# Patient Record
Sex: Female | Born: 1960 | Race: White | Hispanic: No | Marital: Married | Smoking: Never smoker
Health system: Southern US, Community
[De-identification: ages and names within clinical notes are randomized; demographics above are authoritative.]

## PROBLEM LIST (undated history)

## (undated) DIAGNOSIS — K219 Gastro-esophageal reflux disease without esophagitis: Secondary | ICD-10-CM

## (undated) DIAGNOSIS — F419 Anxiety disorder, unspecified: Secondary | ICD-10-CM

## (undated) DIAGNOSIS — F329 Major depressive disorder, single episode, unspecified: Secondary | ICD-10-CM

## (undated) DIAGNOSIS — I499 Cardiac arrhythmia, unspecified: Secondary | ICD-10-CM

## (undated) DIAGNOSIS — R51 Headache: Secondary | ICD-10-CM

## (undated) DIAGNOSIS — A159 Respiratory tuberculosis unspecified: Secondary | ICD-10-CM

## (undated) DIAGNOSIS — R519 Headache, unspecified: Secondary | ICD-10-CM

## (undated) DIAGNOSIS — F32A Depression, unspecified: Secondary | ICD-10-CM

## (undated) DIAGNOSIS — Z87442 Personal history of urinary calculi: Secondary | ICD-10-CM

## (undated) HISTORY — PX: BREAST BIOPSY: SHX20

## (undated) HISTORY — PX: WISDOM TOOTH EXTRACTION: SHX21

## (undated) HISTORY — PX: TONSILLECTOMY: SUR1361

## (undated) HISTORY — PX: OTHER SURGICAL HISTORY: SHX169

## (undated) HISTORY — PX: HERNIA REPAIR: SHX51

---

## 2012-09-01 ENCOUNTER — Other Ambulatory Visit: Payer: Self-pay | Admitting: Family Medicine

## 2012-09-01 DIAGNOSIS — Z1231 Encounter for screening mammogram for malignant neoplasm of breast: Secondary | ICD-10-CM

## 2012-09-21 ENCOUNTER — Ambulatory Visit
Admission: RE | Admit: 2012-09-21 | Discharge: 2012-09-21 | Disposition: A | Discharge status: Home / Self Care | Payer: Managed Care, Other (non HMO) | Source: Ambulatory Visit | Attending: Family Medicine | Admitting: Family Medicine

## 2012-09-21 DIAGNOSIS — Z1231 Encounter for screening mammogram for malignant neoplasm of breast: Secondary | ICD-10-CM

## 2013-09-27 ENCOUNTER — Other Ambulatory Visit: Payer: Self-pay | Admitting: Physician Assistant

## 2013-09-27 DIAGNOSIS — H9319 Tinnitus, unspecified ear: Secondary | ICD-10-CM

## 2013-09-27 DIAGNOSIS — H9122 Sudden idiopathic hearing loss, left ear: Secondary | ICD-10-CM

## 2013-10-03 ENCOUNTER — Ambulatory Visit
Admission: RE | Admit: 2013-10-03 | Discharge: 2013-10-03 | Disposition: A | Discharge status: Home / Self Care | Payer: BC Managed Care – PPO | Source: Ambulatory Visit | Attending: Physician Assistant | Admitting: Physician Assistant

## 2013-10-03 DIAGNOSIS — H9319 Tinnitus, unspecified ear: Secondary | ICD-10-CM

## 2013-10-03 DIAGNOSIS — H9122 Sudden idiopathic hearing loss, left ear: Secondary | ICD-10-CM

## 2013-10-03 MED ORDER — GADOBENATE DIMEGLUMINE 529 MG/ML IV SOLN
15.0000 mL | Freq: Once | INTRAVENOUS | Status: AC | PRN
  Administered 2013-10-03: 15 mL via INTRAVENOUS

## 2014-03-24 ENCOUNTER — Other Ambulatory Visit: Payer: Self-pay

## 2014-03-24 DIAGNOSIS — Z1231 Encounter for screening mammogram for malignant neoplasm of breast: Secondary | ICD-10-CM

## 2014-03-30 ENCOUNTER — Ambulatory Visit: Payer: Self-pay

## 2014-04-20 ENCOUNTER — Ambulatory Visit
Admission: RE | Admit: 2014-04-20 | Discharge: 2014-04-20 | Disposition: A | Discharge status: Home / Self Care | Payer: BLUE CROSS/BLUE SHIELD | Source: Ambulatory Visit

## 2014-04-20 DIAGNOSIS — Z1231 Encounter for screening mammogram for malignant neoplasm of breast: Secondary | ICD-10-CM

## 2014-04-22 ENCOUNTER — Other Ambulatory Visit: Payer: Self-pay | Admitting: Gastroenterology

## 2014-04-22 DIAGNOSIS — R7989 Other specified abnormal findings of blood chemistry: Secondary | ICD-10-CM

## 2014-04-22 DIAGNOSIS — R945 Abnormal results of liver function studies: Principal | ICD-10-CM

## 2014-04-29 ENCOUNTER — Other Ambulatory Visit: Payer: BLUE CROSS/BLUE SHIELD

## 2014-05-03 ENCOUNTER — Other Ambulatory Visit: Payer: BLUE CROSS/BLUE SHIELD

## 2015-12-02 ENCOUNTER — Emergency Department (HOSPITAL_COMMUNITY): Payer: BLUE CROSS/BLUE SHIELD

## 2015-12-02 ENCOUNTER — Encounter (HOSPITAL_COMMUNITY): Payer: Self-pay | Admitting: *Deleted

## 2015-12-02 ENCOUNTER — Emergency Department (HOSPITAL_COMMUNITY)
Admission: EM | Admit: 2015-12-02 | Discharge: 2015-12-03 | Disposition: A | Discharge status: Home / Self Care | Payer: BLUE CROSS/BLUE SHIELD | Attending: Physician Assistant | Admitting: Physician Assistant

## 2015-12-02 DIAGNOSIS — K8020 Calculus of gallbladder without cholecystitis without obstruction: Secondary | ICD-10-CM | POA: Diagnosis not present

## 2015-12-02 DIAGNOSIS — R1011 Right upper quadrant pain: Secondary | ICD-10-CM | POA: Diagnosis present

## 2015-12-02 LAB — COMPREHENSIVE METABOLIC PANEL
ALBUMIN: 4.4 g/dL (ref 3.5–5.0)
ALK PHOS: 51 U/L (ref 38–126)
ALT: 22 U/L (ref 14–54)
ANION GAP: 9 (ref 5–15)
AST: 24 U/L (ref 15–41)
BILIRUBIN TOTAL: 0.5 mg/dL (ref 0.3–1.2)
BUN: 11 mg/dL (ref 6–20)
CALCIUM: 9.9 mg/dL (ref 8.9–10.3)
CO2: 23 mmol/L (ref 22–32)
CREATININE: 0.69 mg/dL (ref 0.44–1.00)
Chloride: 109 mmol/L (ref 101–111)
GFR calc Af Amer: >60 mL/min (ref >60)
GFR calc non Af Amer: >60 mL/min (ref >60)
GLUCOSE: 90 mg/dL (ref 65–99)
Potassium: 3.8 mmol/L (ref 3.5–5.1)
Sodium: 141 mmol/L (ref 135–145)
TOTAL PROTEIN: 7.5 g/dL (ref 6.5–8.1)

## 2015-12-02 LAB — POC URINE PREG, ED: PREG TEST UR: NEGATIVE

## 2015-12-02 LAB — CBC
HCT: 43.1 % (ref 36.0–46.0)
HEMOGLOBIN: 14.2 g/dL (ref 12.0–15.0)
MCH: 27.3 pg (ref 26.0–34.0)
MCHC: 32.9 g/dL (ref 30.0–36.0)
MCV: 82.9 fL (ref 78.0–100.0)
PLATELETS: 235 10*3/uL (ref 150–400)
RBC: 5.2 MIL/uL — ABNORMAL HIGH (ref 3.87–5.11)
RDW: 12.4 % (ref 11.5–15.5)
WBC: 7.4 10*3/uL (ref 4.0–10.5)

## 2015-12-02 LAB — URINALYSIS, ROUTINE W REFLEX MICROSCOPIC
BILIRUBIN URINE: NEGATIVE
Glucose, UA: NEGATIVE mg/dL
HGB URINE DIPSTICK: NEGATIVE
KETONES UR: NEGATIVE mg/dL
NITRITE: NEGATIVE
PROTEIN: NEGATIVE mg/dL
Specific Gravity, Urine: 1.005 (ref 1.005–1.030)
pH: 7 (ref 5.0–8.0)

## 2015-12-02 LAB — URINE MICROSCOPIC-ADD ON
Bacteria, UA: NONE SEEN
RBC / HPF: NONE SEEN RBC/hpf (ref 0–5)

## 2015-12-02 LAB — TROPONIN I

## 2015-12-02 LAB — LIPASE, BLOOD: Lipase: 64 U/L — ABNORMAL HIGH (ref 11–51)

## 2015-12-02 MED ORDER — SODIUM CHLORIDE 0.9 % IV BOLUS (SEPSIS)
1000.0000 mL | Freq: Once | INTRAVENOUS | Status: AC
  Administered 2015-12-02: 1000 mL via INTRAVENOUS

## 2015-12-02 NOTE — ED Notes (Signed)
Patient transported to Ultrasound 

## 2015-12-02 NOTE — ED Notes (Signed)
Pt stated she had upper ABD pain that started at 1400 hrs this date. Pt reports she gets sharp pains constantly in her left upper ABD since she was a child and that still remains the same to this date.

## 2015-12-02 NOTE — ED Notes (Signed)
Per pharmacy - patient stated she stopped taking her Wellbutrin about 1 month ago

## 2015-12-02 NOTE — ED Triage Notes (Signed)
The npt is c/o epigastric pain  That goes through to her posterior chest since 1400  She still has her gb  No problems in the past

## 2015-12-03 MED ORDER — ONDANSETRON HCL 4 MG PO TABS: 4.0000 mg | ORAL_TABLET | Freq: Four times a day (QID) | ORAL | 0 refills | Status: AC

## 2015-12-03 MED ORDER — HYDROCODONE-ACETAMINOPHEN 5-325 MG PO TABS: 1.0000 | ORAL_TABLET | Freq: Four times a day (QID) | ORAL | 0 refills | Status: DC | PRN

## 2015-12-03 NOTE — Discharge Instructions (Signed)
Please follow-up with the surgeons in the next week.  Return to the ER if your symptoms worsen.    Return for increased pain, vomiting, or fever.

## 2015-12-03 NOTE — ED Provider Notes (Signed)
[facility]PT Provider Note   CSN: [id number] Arrival date & time: 12/02/15  1927     History   Chief Complaint Chief Complaint  Patient presents with  . Abdominal Pain    HPI Kathleen Benton is a 55 y.o. female.  Patient presents to the emergency department with right upper quadrant/epigastric abdominal pain that started earlier today. She states the pain was a 10 out of 10, but has improved and has nearly resolved now. She reports some nausea, but no vomiting. She denies any fevers or chills. She has not taken anything for her symptoms. There are no modifying factors. She denies any heavy alcohol use. She denies any prior abdominal surgeries. There are no other associated symptoms.   The history is provided by the patient. No language interpreter was used.    History reviewed. No pertinent past medical history.  There are no active problems to display for this patient.   History reviewed. No pertinent surgical history.  OB History    No data available       Home Medications    Prior to Admission medications   Medication Sig Start Date End Date Taking? Authorizing Provider  clonazePAM (KLONOPIN) 0.5 MG tablet Take 0.125-0.25 mg by mouth 3 (three) times daily as needed. 09/25/15 09/25/16 Yes Historical Provider, MD  ibuprofen (ADVIL,MOTRIN) 200 MG tablet Take 200 mg by mouth every 6 (six) hours as needed for moderate pain.   Yes Historical Provider, MD  Multiple Vitamin (THERA) TABS Take 1 tablet by mouth daily.   Yes Historical Provider, MD  buPROPion (WELLBUTRIN XL) 300 MG 24 hr tablet Take 300 mg by mouth daily.    Historical Provider, MD  HYDROcodone-acetaminophen (NORCO/VICODIN) 5-325 MG tablet Take 1-2 tablets by mouth every 6 (six) hours as needed. 12/03/15   Roxy Horsemanobert Juliany Daughety, PA-C  ondansetron (ZOFRAN) 4 MG tablet Take 1 tablet (4 mg total) by mouth every 6 (six) hours. 12/03/15   Roxy Horsemanobert Tishia Maestre, PA-C    Family History No family history on  file.  Social History Social History  Substance Use Topics  . Smoking status: Never Smoker  . Smokeless tobacco: Never Used  . Alcohol use Yes     Allergies   Amoxicillin; Ciprofloxacin; and Paxil [paroxetine]   Review of Systems Review of Systems  All other systems reviewed and are negative.    Physical Exam Updated Vital Signs BP 151/86   Pulse 77   Temp 97.8 F (36.6 C) (Oral)   Resp 12   Ht 5' 5.5" (1.664 m)   Wt 80.3 kg   SpO2 100%   BMI 29.01 kg/m   Physical Exam  Constitutional: She is oriented to person, place, and time. She appears well-developed and well-nourished.  HENT:  Head: Normocephalic and atraumatic.  Eyes: Conjunctivae and EOM are normal. Pupils are equal, round, and reactive to light.  Neck: Normal range of motion. Neck supple.  Cardiovascular: Normal rate and regular rhythm.  Exam reveals no gallop and no friction rub.   No murmur heard. Pulmonary/Chest: Effort normal and breath sounds normal. No respiratory distress. She has no wheezes. She has no rales. She exhibits no tenderness.  Abdominal: Soft. Bowel sounds are normal. She exhibits no distension and no mass. There is no tenderness. There is no rebound and no guarding.  No focal abdominal tenderness, no RLQ tenderness or pain at McBurney's point, no RUQ tenderness or Murphy's sign, no left-sided abdominal tenderness, no fluid wave, or signs of peritonitis   Musculoskeletal: Normal range  of motion. She exhibits no edema or tenderness.  Neurological: She is alert and oriented to person, place, and time.  Skin: Skin is warm and dry.  Psychiatric: She has a normal mood and affect. Her behavior is normal. Judgment and thought content normal.  Nursing note and vitals reviewed.    ED Treatments / Results  Labs (all labs ordered are listed, but only abnormal results are displayed) Labs Reviewed  LIPASE, BLOOD - Abnormal; Notable for the following:       Result Value   Lipase 64 (*)    All  other components within normal limits  CBC - Abnormal; Notable for the following:    RBC 5.20 (*)    All other components within normal limits  URINALYSIS, ROUTINE W REFLEX MICROSCOPIC (NOT AT [facility]) - Abnormal; Notable for the following:    Leukocytes, UA SMALL (*)    All other components within normal limits  URINE MICROSCOPIC-ADD ON - Abnormal; Notable for the following:    Squamous Epithelial / LPF 0-5 (*)    All other components within normal limits  COMPREHENSIVE METABOLIC PANEL  TROPONIN I  POC URINE PREG, ED    EKG  EKG Interpretation None       Radiology Dg Chest 2 View  Result Date: 12/02/2015 CLINICAL DATA:  55 year old female with chest pain. EXAM: CHEST  2 VIEW COMPARISON:  None. FINDINGS: The cardiomediastinal silhouette is unremarkable. There is no evidence of focal airspace disease, pulmonary edema, suspicious pulmonary nodule/mass, pleural effusion, or pneumothorax. No acute bony abnormalities are identified. IMPRESSION: No active cardiopulmonary disease. Electronically Signed   By: Harmon PierJeffrey  Hu M.D.   On: 12/02/2015 20:44   [place] Abdomen Limited  Result Date: 12/03/2015 CLINICAL DATA:  Epigastric pain. EXAM: US ABDOMEN LIMITED - RIGHT UPPER QUADRANT COMPARISON:  None. FINDINGS: Gallbladder: Layering stones and sludge are seen in the gallbladder. The gallbladder wall is thickened measuring 4.1 mm and there appears to be fluid within the wall. No Murphy's sign reported. No pericholecystic fluid. Common bile duct: Diameter: 6.2 mm Liver: Multiple cysts in the liver.  The largest measures nearly 9 cm. IMPRESSION: 1. Cholelithiasis and sludge in the gallbladder with wall thickening. There also appears to be fluid within the wall of gallbladder but no Murphy's sign or perinephric stranding is identified. The common bile duct is also near the upper limits of normal. Acute cholecystitis cannot be excluded based on this study alone. However, given the lack of a Murphy's sign or  pericholecystic fluid, a HIDA scan should be considered to evaluate cystic duct patency. Electronically Signed   By: Gerome Samavid  Williams III M.D   On: 12/03/2015 00:07    Procedures Procedures (including critical care time)  Medications Ordered in ED Medications  sodium chloride 0.9 % bolus 1,000 mL (1,000 mLs Intravenous New Bag/Given 12/02/15 2245)     Initial Impression / Assessment and Plan / ED Course  I have reviewed the triage vital signs and the nursing notes.  Pertinent labs & imaging results that were available during my care of the patient were reviewed by me and considered in my medical decision making (see chart for details).  Clinical Course     Patient came in for severe right upper quadrant and epigastric abdominal pain that started earlier today. Her symptoms have since resolved completely. Given that she had a mildly elevated lipase, I was concerned about possible gallbladder disease/obstruction. Right upper quadrant ultrasound was obtained, which shows cholelithiasis, without overt evidence of cholecystitis. Common  bile duct is upper limits of normal. As she is pain-free, afebrile, not vomiting, feel the patient can be discharged at this time with instructions to follow-up with general surgery. I discussed this plan with patient, who understands agrees the plan. Specific return cautions given. She is stable and ready for discharge. Final Clinical Impressions(s) / ED Diagnoses   Final diagnoses:  Calculus of gallbladder without cholecystitis without obstruction    New Prescriptions New Prescriptions   HYDROCODONE-ACETAMINOPHEN (NORCO/VICODIN) 5-325 MG TABLET    Take 1-2 tablets by mouth every 6 (six) hours as needed.   ONDANSETRON (ZOFRAN) 4 MG TABLET    Take 1 tablet (4 mg total) by mouth every 6 (six) hours.     Roxy Horseman, PA-C 12/03/15 0044    Courteney Randall An, MD 12/03/15 2342

## 2015-12-12 ENCOUNTER — Ambulatory Visit: Payer: Self-pay | Admitting: Surgery

## 2015-12-12 NOTE — H&P (Signed)
eanne Karasik 12/12/2015 3:04 PM Location: [facility] Patient #: [id number] DOB: 02-Apr-1960 Married / Language: English / Race: White Female  History of Present Illness Kathleen Benton A. Gray Doering MD; 12/12/2015 4:06 PM) Patient words: Patient sent from the emergency room at [facility] secondary to epigastric abdominal pain. She was seen 10 days ago in the emergency room for epigastric pain that radiated to her back. Ultrasound was done which verified gallstones. She was treated medically and released. This was the first time she has sought care for this type of pain. Pain location was her epigastrium. He was sharp and severe in nature. It was made worse with eating fatty foods.        CLINICAL DATA: Epigastric pain. EXAM: US ABDOMEN LIMITED - RIGHT UPPER QUADRANT COMPARISON: None. FINDINGS: Gallbladder: Layering stones and sludge are seen in the gallbladder. The gallbladder wall is thickened measuring 4.1 mm and there appears to be fluid within the wall. No Murphy's sign reported. No pericholecystic fluid. Common bile duct: Diameter: 6.2 mm Liver: Multiple cysts in the liver. The largest measures nearly 9 cm. IMPRESSION: 1. Cholelithiasis and sludge in the gallbladder with wall thickening. There also appears to be fluid within the wall of gallbladder but no Murphy's sign or perinephric stranding is identified. The common bile duct is also near the upper limits of normal. Acute cholecystitis cannot be excluded based on this study alone. However, given the lack of a Murphy's sign or pericholecystic fluid, a HIDA scan should be considered to evaluate cystic duct patency. Electronically Signed By: Kathleen Benton M.D On: 12/03/2015 00:07  Results for Kathleen Benton (MRN [id number]) as of 12/12/2015 16:05 Ref. Range 12/02/2015 19:37 Sodium Latest Ref Range: 135 - 145 mmol/L 141 Potassium Latest Ref Range: 3.5 - 5.1 mmol/L 3.8 Chloride Latest Ref  Range: 101 - 111 mmol/L 109 CO2 Latest Ref Range: 22 - 32 mmol/L 23 BUN Latest Ref Range: 6 - 20 mg/dL 11 Creatinine Latest Ref Range: 0.44 - 1.00 mg/dL 0.69 Calcium Latest Ref Range: 8.9 - 10.3 mg/dL 9.9 EGFR (Non-African Amer.) Latest Ref Range: >60 mL/min >60 EGFR (African American) Latest Ref Range: >60 mL/min >60 Glucose Latest Ref Range: 65 - 99 mg/dL 90 Anion gap Latest Ref Range: 5 - 15 9 Alkaline Phosphatase Latest Ref Range: 38 - 126 U/L 51 Albumin Latest Ref Range: 3.5 - 5.0 g/dL 4.4 Lipase Latest Ref Range: 11 - 51 U/L 64 (H) AST Latest Ref Range: 15 - 41 U/L 24 ALT Latest Ref Range: 14 - 54 U/L 22 Total Protein Latest Ref Range: 6.5 - 8.1 g/dL 7.5 Total Bilirubin Latest Ref Range: 0.3 - 1.2 mg/dL 0.5 Troponin I Latest Ref Range: <0.03 ng/mL <0.03 WBC Latest Ref Range: 4.0 - 10.5 K/uL 7.4 RBC Latest Ref Range: 3.87 - 5.11 MIL/uL 5.20 (H) Hemoglobin Latest Ref Range: 12.0 - 15.0 g/dL 14.2 HCT Latest Ref Range: 36.0 - 46.0 % 43.1 MCV Latest Ref Range: 78.0 - 100.0 fL 82.9 MCH Latest Ref Range: 26.0 - 34.0 pg 27.3 MCHC Latest Ref Range: 30.0 - 36.0 g/dL 32.9 RDW Latest Ref Range: 11.5 - 15.5 % 12.4 Platelets Latest Ref Range: 150 - 400 K/uL 235.  The patient is a 55 year old female.   Other Problems Kathleen Benton, [place]; 12/12/2015 3:04 PM) Anxiety Disorder Back Pain Cholelithiasis Depression Gastroesophageal Reflux Disease Hemorrhoids Hypercholesterolemia Kidney Stone Migraine Headache Umbilical Hernia Repair Ventral Hernia Repair  Past Surgical History Kathleen Benton, [place]; 12/12/2015 3:04 PM) Breast Biopsy Right. Colon  Polyp Removal - Colonoscopy Oral Surgery Tonsillectomy Ventral / Umbilical Hernia Surgery Left.  Diagnostic Studies History Kathleen Benton, [place]; 12/12/2015 3:04 PM) Colonoscopy 1-5 years ago Mammogram 1-3 years ago Pap Smear 1-5 years ago  Allergies Kathleen Benton, CMA; 12/12/2015 3:06 PM) AMOXICILLIN  Rash. CIPROFLOXACIN Hives. PAROXETINE Rash.  Medication History Kathleen Benton, [place]; 12/12/2015 3:07 PM) ClonazePAM (0.5MG Tablet, Oral daily) Active. Ibuprin (200MG Tablet, Oral as needed) Active. Multi-Minerals (Oral daily) Active. Medications Reconciled  Social History Kathleen Benton, [place]; 12/12/2015 3:04 PM) Alcohol use Occasional alcohol use. Caffeine use Coffee. Tobacco use Never smoker.  Family History Kathleen Benton, [place]; 12/12/2015 3:04 PM) Anesthetic complications Father. Cerebrovascular Accident Father. Colon Cancer Brother. Colon Polyps Brother. Depression Daughter, Mother, Sister. Diabetes Mellitus Father. Heart Disease Father. Heart disease in female family member before age 60 Migraine Headache Sister. Prostate Cancer Father.  Pregnancy / Birth History Kathleen Benton, [place]; 12/12/2015 3:04 PM) Age at menarche 41 years. Contraceptive History Oral contraceptives. Gravida 8 Irregular periods Length (months) of breastfeeding 3-6 Maternal age 82-30 Para 8     Review of Systems (Kathleen Benton; 12/12/2015 3:04 PM) General Not Present- Appetite Loss, Chills, Fatigue, Fever, Night Sweats, Weight Gain and Weight Loss. Skin Not Present- Change in Wart/Mole, Dryness, Hives, Jaundice, New Lesions, Non-Healing Wounds, Rash and Ulcer. HEENT Present- Hearing Loss, Ringing in the Ears and Wears glasses/contact lenses. Not Present- Earache, Hoarseness, Nose Bleed, Oral Ulcers, Seasonal Allergies, Sinus Pain, Sore Throat, Visual Disturbances and Yellow Eyes. Respiratory Not Present- Bloody sputum, Chronic Cough, Difficulty Breathing, Snoring and Wheezing. Breast Not Present- Breast Mass, Breast Pain, Nipple Discharge and Skin Changes. Cardiovascular Present- Rapid Heart Rate and Shortness of Breath. Not Present- Chest Pain, Difficulty Breathing Lying Down, Leg Cramps, Palpitations and Swelling of Extremities. Gastrointestinal Present- Hemorrhoids.  Not Present- Abdominal Pain, Bloating, Bloody Stool, Change in Bowel Habits, Chronic diarrhea, Constipation, Difficulty Swallowing, Excessive gas, Gets full quickly at meals, Indigestion, Nausea, Rectal Pain and Vomiting. Female Genitourinary Not Present- Frequency, Nocturia, Painful Urination, Pelvic Pain and Urgency. Musculoskeletal Present- Back Pain. Not Present- Joint Pain, Joint Stiffness, Muscle Pain, Muscle Weakness and Swelling of Extremities. Neurological Not Present- Decreased Memory, Fainting, Headaches, Numbness, Seizures, Tingling, Tremor, Trouble walking and Weakness. Psychiatric Present- Anxiety, Depression, Fearful and Frequent crying. Not Present- Bipolar and Change in Sleep Pattern. Endocrine Present- Hot flashes. Not Present- Cold Intolerance, Excessive Hunger, Hair Changes, Heat Intolerance and New Diabetes. Hematology Not Present- Blood Thinners, Easy Bruising, Excessive bleeding, Gland problems, HIV and Persistent Infections.  Vitals Bary Castilla Bradford CMA; 12/12/2015 3:07 PM) 12/12/2015 3:07 PM Weight: 179.6 lb Height: 65.5in Body Surface Area: 1.9 m Body Mass Index: 29.43 kg/m  Temp.: 98.30F  Pulse: 108 (Regular)  BP: 132/84 (Sitting, Left Arm, Standard)      Physical Exam (Aundra Espin A. Lacara Dunsworth MD; 12/12/2015 4:06 PM)  General Mental Status-Alert. General Appearance-Consistent with stated age. Hydration-Well hydrated. Voice-Normal.  Head and Neck Head-normocephalic, atraumatic with no lesions or palpable masses.  Eye Eyeball - Bilateral-Extraocular movements intact. Sclera/Conjunctiva - Bilateral-No scleral icterus.  Chest and Lung Exam Chest and lung exam reveals -quiet, even and easy respiratory effort with no use of accessory muscles and on auscultation, normal breath sounds, no adventitious sounds and normal vocal resonance. Inspection Chest Wall - Normal. Back - normal.  Cardiovascular Cardiovascular examination reveals  -on palpation PMI is normal in location and amplitude, no palpable S3 or S4. Normal cardiac borders., normal heart sounds, regular rate and rhythm with no murmurs, carotid auscultation reveals no bruits  and normal pedal pulses bilaterally.  Abdomen Inspection Inspection of the abdomen reveals - No Hernias. Skin - Scar - no surgical scars. Palpation/Percussion Palpation and Percussion of the abdomen reveal - Soft, Non Tender, No Rebound tenderness, No Rigidity (guarding) and No hepatosplenomegaly. Auscultation Auscultation of the abdomen reveals - Bowel sounds normal.  Neurologic Neurologic evaluation reveals -alert and oriented x 3 with no impairment of recent or remote memory. Mental Status-Normal.  Musculoskeletal Normal Exam - Left-Upper Extremity Strength Normal and Lower Extremity Strength Normal. Normal Exam - Right-Upper Extremity Strength Normal, Lower Extremity Weakness.    Assessment & Plan (Tamyra Fojtik A. Derian Dimalanta MD; 12/12/2015 4:07 PM)  SYMPTOMATIC CHOLELITHIASIS (K80.20) Impression: Discussed surgical treatment of synthetic RA disease as well as nonoperative management. She has opted for laparoscopic cholecystectomy. The procedure has been discussed with the patient. Risks of laparoscopic cholecystectomy include bleeding, infection, bile duct injury, leak, death, open surgery, diarrhea, other surgery, organ injury, blood vessel injury, DVT, and additional care.  Current Plans You are being scheduled for surgery - Our schedulers will call you.  You should hear from our office's scheduling department within 5 working days about the location, date, and time of surgery. We try to make accommodations for patient's preferences in scheduling surgery, but sometimes the OR schedule or the surgeon's schedule prevents [place] from making those accommodations.  If you have not heard from our office [phone]) in 5 working days, call the office and ask for your surgeon's  nurse.  If you have other questions about your diagnosis, plan, or surgery, call the office and ask for your surgeon's nurse.  Pt Education - Pamphlet Given - Laparoscopic Gallbladder Surgery: discussed with patient and provided information. The anatomy & physiology of hepatobiliary & pancreatic function was discussed. The pathophysiology of gallbladder dysfunction was discussed. Natural history risks without surgery was discussed. I feel the risks of no intervention will lead to serious problems that outweigh the operative risks; therefore, I recommended cholecystectomy to remove the pathology. I explained laparoscopic techniques with possible need for an open approach. Probable cholangiogram to evaluate the bilary tract was explained as well.  Risks such as bleeding, infection, abscess, leak, injury to other organs, need for further treatment, heart attack, death, and other risks were discussed. I noted a good likelihood this will help address the problem. Possibility that this will not correct all abdominal symptoms was explained. Goals of post-operative recovery were discussed as well. We will work to minimize complications. An educational handout further explaining the pathology and treatment options was given as well. Questions were answered. The patient expresses understanding & wishes to proceed with surgery.  Pt Education - [facility] Laparosopic Post Op HCI (Gross) Pt Education - [facility] Good Bowel Health (Gross) Pt Education - Laparoscopic Cholecystectomy: gallbladder

## 2016-01-16 NOTE — Pre-Procedure Instructions (Signed)
Kathleen Benton  01/16/2016      [id number] - [place], [place] - [address] [place] [place] [id number] Phone: [phone] Fax: [phone]    Your procedure is scheduled on   Tuesday  01/23/16  [facility] at 715 A.M.  Call this number if you have problems the morning of surgery:  [phone]   Remember:  Do not eat food or drink liquids after midnight.  Take these medicines the morning of surgery with A SIP OF WATER  CLONAZEPAM IF NEEDED   (STOP 7 DAYS PRIOR TO SURGERY ASPIRIN OR ASPIRIN PRODUCTS, IBUPROFEN/ ADVIL/ MOTRIN, GOODY POWDERS, BC'S, HERBAL MEDICINES ,MULTIVITAMIN)   Do not wear jewelry, make-up or nail polish.  Do not wear lotions, powders, or perfumes, or deoderant.  Do not shave 48 hours prior to surgery.  Men may shave face and neck.  Do not bring valuables to the hospital.  [facility] is not responsible for any belongings or valuables.  Contacts, dentures or bridgework may not be worn into surgery.  Leave your suitcase in the car.  After surgery it may be brought to your room.  For patients admitted to the hospital, discharge time will be determined by your treatment team.  Patients discharged the day of surgery will not be allowed to drive home.   Name and phone number of your driver:    Special instructions:   - Preparing for Surgery  Before surgery, you can play an important role.  Because skin is not sterile, your skin needs to be as free of germs as possible.  You can reduce the number of germs on you skin by washing with CHG (chlorahexidine gluconate) soap before surgery.  CHG is an antiseptic cleaner which kills germs and bonds with the skin to continue killing germs even after washing.  Please DO NOT use if you have an allergy to CHG or antibacterial soaps.  If your skin becomes reddened/irritated stop using the CHG and inform your nurse when you  arrive at Short Stay.  Do not shave (including legs and underarms) for at least 48 hours prior to the first CHG shower.  You may shave your face.  Please follow these instructions carefully:   1.  Shower with CHG Soap the night before surgery and the                                morning of Surgery.  2.  If you choose to wash your hair, wash your hair first as usual with your       normal shampoo.  3.  After you shampoo, rinse your hair and body thoroughly to remove the                      Shampoo.  4.  Use CHG as you would any other liquid soap.  You can apply chg directly       to the skin and wash gently with scrungie or a clean washcloth.  5.  Apply the CHG Soap to your body ONLY FROM THE NECK DOWN.        Do not use on open wounds or open sores.  Avoid contact with your eyes,       ears, mouth and genitals (private parts).  Wash genitals (private parts)  with your normal soap.  6.  Wash thoroughly, paying special attention to the area where your surgery        will be performed.  7.  Thoroughly rinse your body with warm water from the neck down.  8.  DO NOT shower/wash with your normal soap after using and rinsing off       the CHG Soap.  9.  Pat yourself dry with a clean towel.            10.  Wear clean pajamas.            11.  Place clean sheets on your bed the night of your first shower and do not        sleep with pets.  Day of Surgery  Do not apply any lotions/deoderants the morning of surgery.  Please wear clean clothes to the hospital/surgery center.    Please read over the following fact sheets that you were given. Pain Booklet

## 2016-01-17 ENCOUNTER — Encounter (HOSPITAL_COMMUNITY)
Admission: RE | Admit: 2016-01-17 | Discharge: 2016-01-17 | Disposition: A | Discharge status: Home / Self Care | Payer: BLUE CROSS/BLUE SHIELD | Source: Ambulatory Visit | Attending: Surgery | Admitting: Surgery

## 2016-01-17 ENCOUNTER — Encounter (HOSPITAL_COMMUNITY): Payer: Self-pay

## 2016-01-17 DIAGNOSIS — Z01818 Encounter for other preprocedural examination: Secondary | ICD-10-CM | POA: Diagnosis present

## 2016-01-17 HISTORY — DX: Respiratory tuberculosis unspecified: A15.9

## 2016-01-17 HISTORY — DX: Major depressive disorder, single episode, unspecified: F32.9

## 2016-01-17 HISTORY — DX: Headache: R51

## 2016-01-17 HISTORY — DX: Headache, unspecified: R51.9

## 2016-01-17 HISTORY — DX: Cardiac arrhythmia, unspecified: I49.9

## 2016-01-17 HISTORY — DX: Depression, unspecified: F32.A

## 2016-01-17 HISTORY — DX: Anxiety disorder, unspecified: F41.9

## 2016-01-17 HISTORY — DX: Gastro-esophageal reflux disease without esophagitis: K21.9

## 2016-01-17 HISTORY — DX: Personal history of urinary calculi: Z87.442

## 2016-01-17 LAB — CBC
HCT: 42 % (ref 36.0–46.0)
HEMOGLOBIN: 14.3 g/dL (ref 12.0–15.0)
MCH: 27.8 pg (ref 26.0–34.0)
MCHC: 34 g/dL (ref 30.0–36.0)
MCV: 81.7 fL (ref 78.0–100.0)
Platelets: 217 10*3/uL (ref 150–400)
RBC: 5.14 MIL/uL — ABNORMAL HIGH (ref 3.87–5.11)
RDW: 12.3 % (ref 11.5–15.5)
WBC: 5.4 10*3/uL (ref 4.0–10.5)

## 2016-01-17 LAB — HCG, SERUM, QUALITATIVE: PREG SERUM: NEGATIVE

## 2016-01-23 ENCOUNTER — Ambulatory Visit (HOSPITAL_COMMUNITY): Payer: BLUE CROSS/BLUE SHIELD | Admitting: Critical Care Medicine

## 2016-01-23 ENCOUNTER — Ambulatory Visit (HOSPITAL_COMMUNITY)
Admission: RE | Admit: 2016-01-23 | Discharge: 2016-01-23 | Disposition: A | Discharge status: Home / Self Care | Payer: BLUE CROSS/BLUE SHIELD | Source: Ambulatory Visit | Attending: Surgery | Admitting: Surgery

## 2016-01-23 ENCOUNTER — Encounter (HOSPITAL_COMMUNITY): Payer: Self-pay | Admitting: *Deleted

## 2016-01-23 ENCOUNTER — Encounter (HOSPITAL_COMMUNITY)
Admission: RE | Disposition: A | Discharge status: Home / Self Care | Payer: Self-pay | Source: Ambulatory Visit | Attending: Surgery

## 2016-01-23 ENCOUNTER — Ambulatory Visit (HOSPITAL_COMMUNITY): Payer: BLUE CROSS/BLUE SHIELD

## 2016-01-23 DIAGNOSIS — K8010 Calculus of gallbladder with chronic cholecystitis without obstruction: Secondary | ICD-10-CM | POA: Diagnosis present

## 2016-01-23 DIAGNOSIS — F419 Anxiety disorder, unspecified: Secondary | ICD-10-CM | POA: Insufficient documentation

## 2016-01-23 DIAGNOSIS — K219 Gastro-esophageal reflux disease without esophagitis: Secondary | ICD-10-CM | POA: Insufficient documentation

## 2016-01-23 DIAGNOSIS — F329 Major depressive disorder, single episode, unspecified: Secondary | ICD-10-CM | POA: Insufficient documentation

## 2016-01-23 DIAGNOSIS — K8020 Calculus of gallbladder without cholecystitis without obstruction: Secondary | ICD-10-CM

## 2016-01-23 DIAGNOSIS — E7800 Pure hypercholesterolemia, unspecified: Secondary | ICD-10-CM | POA: Diagnosis not present

## 2016-01-23 HISTORY — PX: CHOLECYSTECTOMY: SHX55

## 2016-01-23 SURGERY — LAPAROSCOPIC CHOLECYSTECTOMY WITH INTRAOPERATIVE CHOLANGIOGRAM
Anesthesia: General | Site: Abdomen

## 2016-01-23 MED ORDER — EPHEDRINE SULFATE 50 MG/ML IJ SOLN: INTRAMUSCULAR | Status: DC | PRN

## 2016-01-23 MED ORDER — SODIUM CHLORIDE 0.9 % IV SOLN
INTRAVENOUS | Status: DC | PRN
  Administered 2016-01-23: 15 mL

## 2016-01-23 MED ORDER — SUGAMMADEX SODIUM 200 MG/2ML IV SOLN
INTRAVENOUS | Status: DC | PRN
  Administered 2016-01-23: 150 mg via INTRAVENOUS

## 2016-01-23 MED ORDER — PROMETHAZINE HCL 25 MG/ML IJ SOLN
6.2500 mg | INTRAMUSCULAR | Status: DC | PRN
  Administered 2016-01-23: 6.25 mg via INTRAVENOUS

## 2016-01-23 MED ORDER — OXYCODONE-ACETAMINOPHEN 5-325 MG PO TABS: 1.0000 | ORAL_TABLET | ORAL | 0 refills | Status: AC | PRN

## 2016-01-23 MED ORDER — HYDROMORPHONE HCL 1 MG/ML IJ SOLN
0.2500 mg | INTRAMUSCULAR | Status: DC | PRN
  Administered 2016-01-23 (×6): 0.25 mg via INTRAVENOUS

## 2016-01-23 MED ORDER — SCOPOLAMINE 1 MG/3DAYS TD PT72
1.0000 | MEDICATED_PATCH | TRANSDERMAL | Status: DC
  Administered 2016-01-23: 1.5 mg via TRANSDERMAL

## 2016-01-23 MED ORDER — IOPAMIDOL (ISOVUE-300) INJECTION 61%
INTRAVENOUS | Status: AC
  Filled 2016-01-23: qty 50

## 2016-01-23 MED ORDER — FENTANYL CITRATE (PF) 100 MCG/2ML IJ SOLN
INTRAMUSCULAR | Status: DC | PRN
  Administered 2016-01-23: 50 ug via INTRAVENOUS
  Administered 2016-01-23: 100 ug via INTRAVENOUS
  Administered 2016-01-23: 50 ug via INTRAVENOUS

## 2016-01-23 MED ORDER — BUPIVACAINE HCL 0.25 % IJ SOLN
INTRAMUSCULAR | Status: DC | PRN
  Administered 2016-01-23: 16 mL

## 2016-01-23 MED ORDER — ATROPINE SULFATE 1 MG/ML IJ SOLN
INTRAMUSCULAR | Status: AC
  Filled 2016-01-23: qty 1

## 2016-01-23 MED ORDER — EPHEDRINE SULFATE-NACL 50-0.9 MG/10ML-% IV SOSY
PREFILLED_SYRINGE | INTRAVENOUS | Status: DC | PRN
  Administered 2016-01-23: 5 mg via INTRAVENOUS

## 2016-01-23 MED ORDER — SODIUM CHLORIDE 0.9 % IR SOLN
Status: DC | PRN
  Administered 2016-01-23 (×2): 1000 mL

## 2016-01-23 MED ORDER — PROPOFOL 10 MG/ML IV BOLUS
INTRAVENOUS | Status: AC
  Filled 2016-01-23: qty 20

## 2016-01-23 MED ORDER — MIDAZOLAM HCL 2 MG/2ML IJ SOLN
INTRAMUSCULAR | Status: AC
  Filled 2016-01-23: qty 2

## 2016-01-23 MED ORDER — SUCCINYLCHOLINE CHLORIDE 200 MG/10ML IV SOSY
PREFILLED_SYRINGE | INTRAVENOUS | Status: AC
  Filled 2016-01-23: qty 10

## 2016-01-23 MED ORDER — PROMETHAZINE HCL 25 MG/ML IJ SOLN
INTRAMUSCULAR | Status: AC
  Administered 2016-01-23: 6.25 mg via INTRAVENOUS
  Filled 2016-01-23: qty 1

## 2016-01-23 MED ORDER — ROCURONIUM BROMIDE 50 MG/5ML IV SOSY
PREFILLED_SYRINGE | INTRAVENOUS | Status: AC
  Filled 2016-01-23: qty 5

## 2016-01-23 MED ORDER — CLINDAMYCIN PHOSPHATE 600 MG/50ML IV SOLN
600.0000 mg | Freq: Once | INTRAVENOUS | Status: AC
  Administered 2016-01-23: 600 mg via INTRAVENOUS
  Filled 2016-01-23: qty 50

## 2016-01-23 MED ORDER — LACTATED RINGERS IV SOLN
Freq: Once | INTRAVENOUS | Status: AC
  Administered 2016-01-23: 09:00:00 via INTRAVENOUS

## 2016-01-23 MED ORDER — EPHEDRINE 5 MG/ML INJ
INTRAVENOUS | Status: AC
  Filled 2016-01-23: qty 10

## 2016-01-23 MED ORDER — SUGAMMADEX SODIUM 200 MG/2ML IV SOLN
INTRAVENOUS | Status: AC
  Filled 2016-01-23: qty 2

## 2016-01-23 MED ORDER — FENTANYL CITRATE (PF) 100 MCG/2ML IJ SOLN
INTRAMUSCULAR | Status: AC
  Filled 2016-01-23: qty 4

## 2016-01-23 MED ORDER — PROPOFOL 10 MG/ML IV BOLUS
INTRAVENOUS | Status: DC | PRN
  Administered 2016-01-23: 50 mg via INTRAVENOUS
  Administered 2016-01-23: 150 mg via INTRAVENOUS

## 2016-01-23 MED ORDER — HYDROMORPHONE HCL 1 MG/ML IJ SOLN
INTRAMUSCULAR | Status: AC
  Administered 2016-01-23: 0.25 mg via INTRAVENOUS
  Filled 2016-01-23: qty 1

## 2016-01-23 MED ORDER — DIPHENHYDRAMINE HCL 50 MG/ML IJ SOLN
INTRAMUSCULAR | Status: DC | PRN
  Administered 2016-01-23: 12.5 mg via INTRAVENOUS

## 2016-01-23 MED ORDER — HEMOSTATIC AGENTS (NO CHARGE) OPTIME
TOPICAL | Status: DC | PRN
  Administered 2016-01-23: 1 via TOPICAL

## 2016-01-23 MED ORDER — DEXAMETHASONE SODIUM PHOSPHATE 10 MG/ML IJ SOLN
INTRAMUSCULAR | Status: DC | PRN
  Administered 2016-01-23: 10 mg via INTRAVENOUS

## 2016-01-23 MED ORDER — CHLORHEXIDINE GLUCONATE CLOTH 2 % EX PADS: 6.0000 | MEDICATED_PAD | Freq: Once | CUTANEOUS | Status: DC

## 2016-01-23 MED ORDER — ONDANSETRON HCL 4 MG/2ML IJ SOLN
INTRAMUSCULAR | Status: DC | PRN
  Administered 2016-01-23: 4 mg via INTRAVENOUS

## 2016-01-23 MED ORDER — MIDAZOLAM HCL 5 MG/5ML IJ SOLN
INTRAMUSCULAR | Status: DC | PRN
  Administered 2016-01-23: 2 mg via INTRAVENOUS

## 2016-01-23 MED ORDER — SCOPOLAMINE 1 MG/3DAYS TD PT72
MEDICATED_PATCH | TRANSDERMAL | Status: AC
  Administered 2016-01-23: 1.5 mg via TRANSDERMAL
  Filled 2016-01-23: qty 1

## 2016-01-23 MED ORDER — LACTATED RINGERS IV SOLN
INTRAVENOUS | Status: DC | PRN
  Administered 2016-01-23 (×2): via INTRAVENOUS

## 2016-01-23 MED ORDER — ROCURONIUM BROMIDE 50 MG/5ML IV SOSY
PREFILLED_SYRINGE | INTRAVENOUS | Status: DC | PRN
  Administered 2016-01-23: 50 mg via INTRAVENOUS

## 2016-01-23 MED ORDER — LIDOCAINE 2% (20 MG/ML) 5 ML SYRINGE
INTRAMUSCULAR | Status: AC
  Filled 2016-01-23: qty 5

## 2016-01-23 MED ORDER — LIDOCAINE HCL (CARDIAC) 20 MG/ML IV SOLN
INTRAVENOUS | Status: DC | PRN
  Administered 2016-01-23: 80 mg via INTRAVENOUS

## 2016-01-23 MED ORDER — BUPIVACAINE HCL (PF) 0.25 % IJ SOLN
INTRAMUSCULAR | Status: AC
  Filled 2016-01-23: qty 30

## 2016-01-23 MED ORDER — 0.9 % SODIUM CHLORIDE (POUR BTL) OPTIME
TOPICAL | Status: DC | PRN
  Administered 2016-01-23: 1000 mL

## 2016-01-23 SURGICAL SUPPLY — 41 items
APPLIER CLIP ROT 10 11.4 M/L (STAPLE) ×2
BLADE SURG ROTATE 9660 (MISCELLANEOUS) IMPLANT
CANISTER SUCTION 2500CC (MISCELLANEOUS) ×2 IMPLANT
CHLORAPREP W/TINT 26ML (MISCELLANEOUS) ×2 IMPLANT
CLIP APPLIE ROT 10 11.4 M/L (STAPLE) ×1 IMPLANT
COVER MAYO STAND STRL (DRAPES) ×2 IMPLANT
COVER SURGICAL LIGHT HANDLE (MISCELLANEOUS) ×2 IMPLANT
DERMABOND ADVANCED (GAUZE/BANDAGES/DRESSINGS) ×1
DERMABOND ADVANCED .7 DNX12 (GAUZE/BANDAGES/DRESSINGS) ×1 IMPLANT
DRAPE C-ARM 42X72 X-RAY (DRAPES) ×2 IMPLANT
DRAPE WARM FLUID 44X44 (DRAPE) ×2 IMPLANT
ELECT REM PT RETURN 9FT ADLT (ELECTROSURGICAL) ×2
ELECTRODE REM PT RTRN 9FT ADLT (ELECTROSURGICAL) ×1 IMPLANT
GLOVE BIO SURGEON STRL SZ8 (GLOVE) ×2 IMPLANT
GLOVE BIOGEL PI IND STRL 8 (GLOVE) ×1 IMPLANT
GLOVE BIOGEL PI INDICATOR 8 (GLOVE) ×1
GOWN STRL REUS W/ TWL LRG LVL3 (GOWN DISPOSABLE) ×3 IMPLANT
GOWN STRL REUS W/ TWL XL LVL3 (GOWN DISPOSABLE) ×1 IMPLANT
GOWN STRL REUS W/TWL LRG LVL3 (GOWN DISPOSABLE) ×3
GOWN STRL REUS W/TWL XL LVL3 (GOWN DISPOSABLE) ×1
HEMOSTAT SNOW SURGICEL 2X4 (HEMOSTASIS) ×2 IMPLANT
KIT BASIN OR (CUSTOM PROCEDURE TRAY) ×2 IMPLANT
KIT ROOM TURNOVER OR (KITS) ×2 IMPLANT
NS IRRIG 1000ML POUR BTL (IV SOLUTION) ×2 IMPLANT
PAD ARMBOARD 7.5X6 YLW CONV (MISCELLANEOUS) ×2 IMPLANT
POUCH SPECIMEN RETRIEVAL 10MM (ENDOMECHANICALS) ×2 IMPLANT
SCISSORS LAP 5X35 DISP (ENDOMECHANICALS) ×2 IMPLANT
SET CHOLANGIOGRAPH 5 50 .035 (SET/KITS/TRAYS/PACK) ×2 IMPLANT
SET IRRIG TUBING LAPAROSCOPIC (IRRIGATION / IRRIGATOR) ×2 IMPLANT
SLEEVE ENDOPATH XCEL 5M (ENDOMECHANICALS) ×2 IMPLANT
SPECIMEN JAR SMALL (MISCELLANEOUS) ×2 IMPLANT
SUT MNCRL AB 4-0 PS2 18 (SUTURE) ×2 IMPLANT
SUT NOVA NAB DX-16 0-1 5-0 T12 (SUTURE) ×2 IMPLANT
SUT VIC AB 2-0 CT1 36 (SUTURE) ×2 IMPLANT
TOWEL OR 17X24 6PK STRL BLUE (TOWEL DISPOSABLE) ×2 IMPLANT
TOWEL OR 17X26 10 PK STRL BLUE (TOWEL DISPOSABLE) ×2 IMPLANT
TRAY LAPAROSCOPIC MC (CUSTOM PROCEDURE TRAY) ×2 IMPLANT
TROCAR XCEL BLUNT TIP 100MML (ENDOMECHANICALS) ×2 IMPLANT
TROCAR XCEL NON-BLD 11X100MML (ENDOMECHANICALS) ×2 IMPLANT
TROCAR XCEL NON-BLD 5MMX100MML (ENDOMECHANICALS) ×2 IMPLANT
TUBING INSUFFLATION (TUBING) ×2 IMPLANT

## 2016-01-23 NOTE — Anesthesia Preprocedure Evaluation (Addendum)
Anesthesia Evaluation  Patient identified by MRN, date of birth, ID band Patient awake    Reviewed: Allergy & Precautions, NPO status , Patient's Chart, lab work & pertinent test results  Airway Mallampati: II  TM Distance: >3 FB Neck ROM: Full    Dental  (+) Teeth Intact, Dental Advisory Given   Pulmonary    breath sounds clear to auscultation       Cardiovascular  Rhythm:Regular     Neuro/Psych  Headaches,    GI/Hepatic GERD  Controlled,  Endo/Other    Renal/GU      Musculoskeletal   Abdominal   Peds  Hematology   Anesthesia Other Findings   Reproductive/Obstetrics                            Anesthesia Physical Anesthesia Plan  ASA: II  Anesthesia Plan: General   Post-op Pain Management:    Induction: Intravenous  Airway Management Planned: Oral ETT  Additional Equipment:   Intra-op Plan:   Post-operative Plan: Extubation in OR  Informed Consent: I have reviewed the patients History and Physical, chart, labs and discussed the procedure including the risks, benefits and alternatives for the proposed anesthesia with the patient or authorized representative who has indicated his/her understanding and acceptance.   Dental advisory given  Plan Discussed with: Anesthesiologist and Surgeon  Anesthesia Plan Comments:         Anesthesia Quick Evaluation

## 2016-01-23 NOTE — Transfer of Care (Signed)
Immediate Anesthesia Transfer of Care Note  Patient: Kathleen LittlerJeanne Balandran  Procedure(s) Performed: Procedure(s): LAPAROSCOPIC CHOLECYSTECTOMY WITH INTRAOPERATIVE CHOLANGIOGRAM (N/A)  Patient Location: PACU  Anesthesia Type:General  Level of Consciousness: awake, alert  and oriented  Airway & Oxygen Therapy: Patient Spontanous Breathing and Patient connected to nasal cannula oxygen  Post-op Assessment: Report given to RN, Post -op Vital signs reviewed and stable and Patient moving all extremities X 4  Post vital signs: Reviewed and stable  Last Vitals:  Vitals:   01/23/16 0823  BP: (!) 156/101  Pulse: (!) 114  Resp: 20  Temp: 36.9 C    Last Pain:  Vitals:   01/23/16 0823  TempSrc: Oral      Patients Stated Pain Goal: 3 (01/23/16 0810)  Complications: No apparent anesthesia complications

## 2016-01-23 NOTE — Anesthesia Procedure Notes (Signed)
Procedure Name: Intubation Date/Time: 01/23/2016 9:13 AM Performed by: Merrilyn Puma B Pre-anesthesia Checklist: Patient identified, Emergency Drugs available, Suction available, Patient being monitored and Timeout performed Patient Re-evaluated:Patient Re-evaluated prior to inductionOxygen Delivery Method: Circle system utilized Preoxygenation: Pre-oxygenation with 100% oxygen Intubation Type: IV induction Ventilation: Mask ventilation without difficulty Laryngoscope Size: Mac and 3 Grade View: Grade I Tube type: Oral Tube size: 7.0 mm Number of attempts: 1 Airway Equipment and Method: Stylet Placement Confirmation: ETT inserted through vocal cords under direct vision,  positive ETCO2,  CO2 detector and breath sounds checked- equal and bilateral Secured at: 21 cm Tube secured with: Tape Dental Injury: Teeth and Oropharynx as per pre-operative assessment  Comments: DLx1 by Carver Fila, SRNA

## 2016-01-23 NOTE — Interval H&P Note (Signed)
History and Physical Interval Note:  01/23/2016 9:07 AM  [facility]  has presented today for surgery, with the diagnosis of GALLSTONES  The various methods of treatment have been discussed with the patient and family. After consideration of risks, benefits and other options for treatment, the patient has consented to  Procedure(s): LAPAROSCOPIC CHOLECYSTECTOMY WITH INTRAOPERATIVE CHOLANGIOGRAM (N/A) as a surgical intervention .  The patient's history has been reviewed, patient examined, no change in status, stable for surgery.  I have reviewed the patient's chart and labs.  Questions were answered to the patient's satisfaction.     Rubby Barbary A.

## 2016-01-23 NOTE — Interval H&P Note (Signed)
History and Physical Interval Note:  01/23/2016 9:04 AM  Kathleen B[facility]  has presented today for surgery, with the diagnosis of GALLSTONES  The various methods of treatment have been discussed with the patient and family. After consideration of risks, benefits and other options for treatment, the patient has consented to  Procedure(s): LAPAROSCOPIC CHOLECYSTECTOMY WITH INTRAOPERATIVE CHOLANGIOGRAM (N/A) as a surgical intervention .  The patient's history has been reviewed, patient examined, no change in status, stable for surgery.  I have reviewed the patient's chart and labs.  Questions were answered to the patient's satisfaction.     [name] A.

## 2016-01-23 NOTE — Anesthesia Preprocedure Evaluation (Signed)
Anesthesia Evaluation  Patient identified by MRN, date of birth, ID band Patient awake    Reviewed: Allergy & Precautions, NPO status , Patient's Chart, lab work & pertinent test results  History of Anesthesia Complications Negative for: history of anesthetic complications  Airway Mallampati: II  TM Distance: >3 FB Neck ROM: Full    Dental no notable dental hx. (+) Dental Advisory Given   Pulmonary neg pulmonary ROS,    Pulmonary exam normal        Cardiovascular negative cardio ROS Normal cardiovascular exam     Neuro/Psych  Headaches, Anxiety Depression    GI/Hepatic negative GI ROS, Neg liver ROS, GERD  ,  Endo/Other  negative endocrine ROS  Renal/GU negative Renal ROS  negative genitourinary   Musculoskeletal negative musculoskeletal ROS (+)   Abdominal   Peds negative pediatric ROS (+)  Hematology negative hematology ROS (+)   Anesthesia Other Findings   Reproductive/Obstetrics negative OB ROS                             Anesthesia Physical Anesthesia Plan  ASA: II  Anesthesia Plan: General   Post-op Pain Management:    Induction: Intravenous  Airway Management Planned: Oral ETT  Additional Equipment:   Intra-op Plan:   Post-operative Plan: Extubation in OR  Informed Consent: I have reviewed the patients History and Physical, chart, labs and discussed the procedure including the risks, benefits and alternatives for the proposed anesthesia with the patient or authorized representative who has indicated his/her understanding and acceptance.   Dental advisory given  Plan Discussed with: CRNA and Anesthesiologist  Anesthesia Plan Comments:         Anesthesia Quick Evaluation

## 2016-01-23 NOTE — H&P (Signed)
Pre-op/Pre-procedure Orders Open     [facility]  Kathleen Luna, MD  General Surgery   Additional Documentation   Encounter Info:   Billing Info,   History,   Allergies,   Detailed Report     All Notes   H&P by Kathleen Luna, MD at7 4:04 PM   Author: Erroll Luna, MD Author Type: Physician Filed:  M  Note Status: Signed Cosign: Cosign Not Required Encounter Date: 04 PM  Editor: Kathleen Luna, MD (Physician)    K[facility]  Location: [facility] Patient #: [id number] DOB: 1960-07-01 Married / Language: [facility] / Race: White Female  History of Present Illness (Kathleen A. Cornett MD;  Patient words: Patient [facility] secondary to epigastric abdominal pain. She was seen 10 days ago in the emergency room for epigastric pain that radiated to her back. Ultrasound was done which verified gallstones. She was treated medically and released. This was the first time she has sought care for this type of pain. Pain location was her epigastrium. He was sharp and severe in nature. It was made worse with eating fatty foods.        CLINICAL DATA: Epigastric pain. EXAM: US ABDOMEN LIMITED - RIGHT UPPER QUADRANT COMPARISON: None. FINDINGS: Gallbladder: Layering stones and sludge are seen in the gallbladder. The gallbladder wall is thickened measuring 4.1 mm and there appears to be fluid within the wall. No Murphy's sign reported. No pericholecystic fluid. Common bile duct: Diameter: 6.2 mm Liver: Multiple cysts in the liver. The largest measures nearly 9 cm. IMPRESSION: 1. Cholelithiasis and sludge in the gallbladder with wall thickening. There also appears to be fluid within the wall of gallbladder but no Murphy's sign or perinephric stranding is identified. The common bile duct is also near the upper limits of normal. Acute cholecystitis cannot be excluded based on this study alone. However,  given the lack of a Murphy's sign or pericholecystic fluid, a HIDA scan should be considered to evaluate cystic duct patency. Electronically Signed By: [facility] M.D On: 12/03/2015 00:07  Results for Kathleen [facility] (MRN [id number]) as of 12/12/2015 16:05 Ref. Range 12/02/2015 19:37 Sodium Latest Ref Range: 135 - 145 mmol/L 141 Potassium Latest Ref Range: 3.5 - 5.1 mmol/L 3.8 Chloride Latest Ref Range: 101 - 111 mmol/L 109 CO2 Latest Ref Range: 22 - 32 mmol/L 23 BUN Latest Ref Range: 6 - 20 mg/dL 11 Creatinine Latest Ref Range: 0.44 - 1.00 mg/dL 0.69 Calcium Latest Ref Range: 8.9 - 10.3 mg/dL 9.9 EGFR (Non-African Amer.) Latest Ref Range: >60 mL/min >60 EGFR (African American) Latest Ref Range: >60 mL/min >60 Glucose Latest Ref Range: 65 - 99 mg/dL 90 Anion gap Latest Ref Range: 5 - 15 9 Alkaline Phosphatase Latest Ref Range: 38 - 126 U/L 51 Albumin Latest Ref Range: 3.5 - 5.0 g/dL 4.4 Lipase Latest Ref Range: 11 - 51 U/L 64 (H) AST Latest Ref Range: 15 - 41 U/L 24 ALT Latest Ref Range: 14 - 54 U/L 22 Total Protein Latest Ref Range: 6.5 - 8.1 g/dL 7.5 Total Bilirubin Latest Ref Range: 0.3 - 1.2 mg/dL 0.5 Troponin I Latest Ref Range: <0.03 ng/mL <0.03 WBC Latest Ref Range: 4.0 - 10.5 K/uL 7.4 RBC Latest Ref Range: 3.87 - 5.11 MIL/uL 5.20 (H) Hemoglobin Latest Ref Range: 12.0 - 15.0 g/dL 14.2 HCT Latest Ref Range: 36.0 - 46.0 % 43.1 MCV Latest Ref Range: 78.0 - 100.0 fL 82.9 MCH Latest Ref Range: 26.0 - 34.0  pg 27.3 MCHC Latest Ref Range: 30.0 - 36.0 g/dL 32.9 RDW Latest Ref Range: 11.5 - 15.5 % 12.4 Platelets Latest Ref Range: 150 - 400 K/uL 235.  The patient is a 56 year old female.   Other Problems [facility], CMA; 3:04 PM) Anxiety Disorder Back Pain Cholelithiasis Depression Gastroesophageal Reflux Disease Hemorrhoids Hypercholesterolemia Kidney Stone Migraine Headache Umbilical Hernia Repair Ventral Hernia Repair  Past Surgical  History [facility], CMA; 1 3:04 PM) Breast Biopsy Right. Colon Polyp Removal - Colonoscopy Oral Surgery Tonsillectomy Ventral / Umbilical Hernia Surgery Left.  Diagnostic Studies History [facility], [place]; 12/12/2015 3:04 PM) Colonoscopy 1-5 years ago Mammogram 1-3 years ago Pap Smear 1-5 years ago  Allergies [facility], CMA; 12/12/2015 3:06 PM) AMOXICILLIN Rash. CIPROFLOXACIN Hives. PAROXETINE Rash.  Medication History (Kathleen [facility], CMA;  3:07 PM) ClonazePAM (0.5MG Tablet, Oral daily) Active. Ibuprin (200MG Tablet, Oral as needed) Active. Multi-Minerals (Oral daily) Active. Medications Reconciled  Social History [facility], CMA;  PM) Alcohol use Occasional alcohol use. Caffeine use Coffee. Tobacco use Never smoker.  Family History (Kathleen [facility], [place];  3:04 PM) Anesthetic complications Father. Cerebrovascular Accident Father. Colon Cancer Brother. Colon Polyps Brother. Depression Daughter, Mother, Sister. Diabetes Mellitus Father. Heart Disease Father. Heart disease in female family member before age 61 Migraine Headache Sister. Prostate Cancer Father.  Pregnancy / Birth History Kathleen [facility], [place]; 58 3:04 PM) Age at menarche 42 years. Contraceptive History Oral contraceptives. Gravida 8 Irregular periods Length (months) of breastfeeding 3-6 Maternal age 56-30 Para 8     Review of Systems [facility] CMA;  3:04 PM) General Not Present- Appetite Loss, Chills, Fatigue, Fever, Night Sweats, Weight Gain and Weight Loss. Skin Not Present- Change in Wart/Mole, Dryness, Hives, Jaundice, New Lesions, Non-Healing Wounds, Rash and Ulcer. HEENT Present- Hearing Loss, Ringing in the Ears and Wears glasses/contact lenses. Not Present- Earache, Hoarseness, Nose Bleed, Oral Ulcers, Seasonal Allergies, Sinus Pain, Sore Throat, Visual Disturbances and Yellow Eyes. Respiratory Not Present- Bloody sputum, Chronic  Cough, Difficulty Breathing, Snoring and Wheezing. Breast Not Present- Breast Mass, Breast Pain, Nipple Discharge and Skin Changes. Cardiovascular Present- Rapid Heart Rate and Shortness of Breath. Not Present- Chest Pain, Difficulty Breathing Lying Down, Leg Cramps, Palpitations and Swelling of Extremities. Gastrointestinal Present- Hemorrhoids. Not Present- Abdominal Pain, Bloating, Bloody Stool, Change in Bowel Habits, Chronic diarrhea, Constipation, Difficulty Swallowing, Excessive gas, Gets full quickly at meals, Indigestion, Nausea, Rectal Pain and Vomiting. Female Genitourinary Not Present- Frequency, Nocturia, Painful Urination, Pelvic Pain and Urgency. Musculoskeletal Present- Back Pain. Not Present- Joint Pain, Joint Stiffness, Muscle Pain, Muscle Weakness and Swelling of Extremities. Neurological Not Present- Decreased Memory, Fainting, Headaches, Numbness, Seizures, Tingling, Tremor, Trouble walking and Weakness. Psychiatric Present- Anxiety, Depression, Fearful and Frequent crying. Not Present- Bipolar and Change in Sleep Pattern. Endocrine Present- Hot flashes. Not Present- Cold Intolerance, Excessive Hunger, Hair Changes, Heat Intolerance and New Diabetes. Hematology Not Present- Blood Thinners, Easy Bruising, Excessive bleeding, Gland problems, HIV and Persistent Infections.  Vitals Kathleen Castilla Bradford CMA;  12/12/2015 3:07 PM Weight: 179.6 lb Height: 65.5in Body Surface Area: 1.9 m Body Mass Index: 29.43 kg/m  Temp.: 98.73F  Pulse: 108 (Regular)  BP: 132/84 (Sitting, Left Arm, Standard)      Physical Exam (Kathleen A. Cornett MD;  PM)  General Mental Status-Alert. General Appearance-Consistent with stated age. Hydration-Well hydrated. Voice-Normal.  Head and Neck Head-normocephalic, atraumatic with no lesions or palpable masses.  Eye Eyeball - Bilateral-Extraocular movements intact. Sclera/Conjunctiva - Bilateral-No scleral  icterus.  Chest  and Lung Exam Chest and lung exam reveals -quiet, even and easy respiratory effort with no use of accessory muscles and on auscultation, normal breath sounds, no adventitious sounds and normal vocal resonance. Inspection Chest Wall - Normal. Back - normal.  Cardiovascular Cardiovascular examination reveals -on palpation PMI is normal in location and amplitude, no palpable S3 or S4. Normal cardiac borders., normal heart sounds, regular rate and rhythm with no murmurs, carotid auscultation reveals no bruits and normal pedal pulses bilaterally.  Abdomen Inspection Inspection of the abdomen reveals - No Hernias. Skin - Scar - no surgical scars. Palpation/Percussion Palpation and Percussion of the abdomen reveal - Soft, Non Tender, No Rebound tenderness, No Rigidity (guarding) and No hepatosplenomegaly. Auscultation Auscultation of the abdomen reveals - Bowel sounds normal. small reducible umbilical hernia  Neurologic Neurologic evaluation reveals -alert and oriented x 3 with no impairment of recent or remote memory. Mental Status-Normal.  Musculoskeletal Normal Exam - Left-Upper Extremity Strength Normal and Lower Extremity Strength Normal. Normal Exam - Right-Upper Extremity Strength Normal, Lower Extremity Weakness.    Assessment & Plan (Kathleen A. Cornett MD; M) Umbilical hernia  See below   SYMPTOMATIC CHOLELITHIASIS (K80.20) Impression: Discussed surgical treatment of GB disease  disease as well as nonoperative management. She has opted for laparoscopic cholecystectomy. The procedure has been discussed with the patient. Risks of laparoscopic cholecystectomy include bleeding, infection, bile duct injury, leak, death, open surgery, diarrhea, other surgery, organ injury, blood vessel injury, DVT, and additional care. Pt has small umbilical hernia   That will be closed with the umbilical port site.  The risk of hernia repair include bleeding,   Infection,   Recurrence of the hernia,  Mesh use, chronic pain,  Organ injury,  Bowel injury,  Bladder injury,   nerve injury with numbness around the incision,  Death,  and worsening of preexisting  medical problems.  The alternatives to surgery have been discussed as well..  Long term expectations of both operative and non operative treatments have been discussed.   The patient agrees to proceed. Current Plans You are being scheduled for surgery - Our schedulers will call you.  You should hear from our office's scheduling department within 5 working days about the location, date, and time of surgery. We try to make accommodations for patient's preferences in scheduling surgery, but sometimes the OR schedule or the surgeon's schedule prevents [place] from making those accommodations.  If you have not heard from our office [phone]) in 5 working days, call the office and ask for your surgeon's nurse.  If you have other questions about your diagnosis, plan, or surgery, call the office and ask for your surgeon's nurse.  Pt Education - Pamphlet Given - Laparoscopic Gallbladder Surgery: discussed with patient and provided information. The anatomy & physiology of hepatobiliary & pancreatic function was discussed. The pathophysiology of gallbladder dysfunction was discussed. Natural history risks without surgery was discussed. I feel the risks of no intervention will lead to serious problems that outweigh the operative risks; therefore, I recommended cholecystectomy to remove the pathology. I explained laparoscopic techniques with possible need for an open approach. Probable cholangiogram to evaluate the bilary tract was explained as well.  Risks such as bleeding, infection, abscess, leak, injury to other organs, need for further treatment, heart attack, death, and other risks were discussed. I noted a good likelihood this will help address the problem. Possibility that this will not correct all  abdominal symptoms was explained. Goals of post-operative recovery were discussed as well.  We will work to minimize complications. An educational handout further explaining the pathology and treatment options was given as well. Questions were answered. The patient expresses understanding & wishes to proceed with surgery.  Pt Education - [facility] Laparosopic Post Op HCI (Gross) Pt Education - [facility] Good Bowel Health (Gross) Pt Education - Laparoscopic Cholecystectomy: gallbladder

## 2016-01-23 NOTE — Op Note (Signed)
Laparoscopic Cholecystectomy with IOC Procedure Note  Indications: This patient presents with symptomatic gallbladder disease and will undergo laparoscopic cholecystectomy.The procedure has been discussed with the patient. Operative and non operative treatments have been discussed. Risks of surgery include bleeding, infection,  Common bile duct injury,  Injury to the stomach,liver, colon,small intestine, abdominal wall,  Diaphragm,  Major blood vessels,  And the need for an open procedure.  Other risks include worsening of medical problems, death,  DVT and pulmonary embolism, and cardiovascular events.   Medical options have also been discussed. The patient has been informed of long term expectations of surgery and non surgical options,  The patient agrees to proceed.  She also had a small umbilical hernia that was going to be used for access.  She wanted this closed as well.    Pre-operative Diagnosis: Calculus of gallbladder without mention of cholecystitis or obstruction  Post-operative Diagnosis: Same  Surgeon: Hillis Mcphatter A.   Assistants: Hitchcock RNFA   Anesthesia: General endotracheal anesthesia and Local anesthesia 0.25.% bupivacaine, with epinephrine  ASA Class: 2  Procedure Details  The patient was seen again in the Holding Room. The risks, benefits, complications, treatment options, and expected outcomes were discussed with the patient. The possibilities of reaction to medication, pulmonary aspiration, perforation of viscus, bleeding, recurrent infection, finding a normal gallbladder, the need for additional procedures, failure to diagnose a condition, the possible need to convert to an open procedure, and creating a complication requiring transfusion or operation were discussed with the patient. The patient and/or family concurred with the proposed plan, giving informed consent. The site of surgery properly noted/marked. The patient was taken to Operating Room, identified as [facility] and the procedure verified as Laparoscopic Cholecystectomy with Intraoperative Cholangiograms. A Time Out was held and the above information confirmed.  Prior to the induction of general anesthesia, antibiotic prophylaxis was administered. General endotracheal anesthesia was then administered and tolerated well. After the induction, the abdomen was prepped in the usual sterile fashion. The patient was positioned in the supine position with the left arm comfortably tucked, along with some reverse Trendelenburg.  Local anesthetic agent was injected into the skin near the umbilicus and an incision made. The midline fascia was incised and the Hasson technique was used to introduce a 12 mm port under direct vision. It was secured with a figure of eight Vicryl suture placed in the usual fashion. Pneumoperitoneum was then created with CO2 and tolerated well without any adverse changes in the patient's vital signs. Additional trocars were introduced under direct vision with an 11 mm trocar in the epigastrium and 2 5 mm trocars in the right upper quadrant. All skin incisions were infiltrated with a local anesthetic agent before making the incision and placing the trocars.   The gallbladder was identified, the fundus grasped and retracted cephalad. Adhesions were lysed bluntly and with the electrocautery where indicated, taking care not to injure any adjacent organs or viscus. The infundibulum was grasped and retracted laterally, exposing the peritoneum overlying the triangle of Calot. This was then divided and exposed in a blunt fashion. The cystic duct was clearly identified and bluntly dissected circumferentially. The junctions of the gallbladder, cystic duct and common bile duct were clearly identified prior to the division of any linear structure.   An incision was made in the cystic duct and the cholangiogram catheter introduced. The catheter was secured using an endoclip. The study showed no stones and  good visualization of the distal and proximal biliary tree.  The catheter was then removed.   The cystic duct was then  ligated with surgical clips  on the patient side and  clipped on the gallbladder side and divided. The cystic artery was identified, dissected free, ligated with clips and divided as well. Posterior cystic artery clipped and divided.  The gallbladder was dissected from the liver bed in retrograde fashion with the electrocautery. The gallbladder was removed. The liver bed was irrigated and inspected. Hemostasis was achieved with the electrocautery. Copious irrigation was utilized and was repeatedly aspirated until clear all particulate matter. Hemostasis was achieved with no signs of bleeding or bile leakage.  Pneumoperitoneum was completely reduced after viewing removal of the trocars under direct vision. The wound was thoroughly irrigated and the fascia was then closed with a 1 O  Novafil  Sutures to close small umbilical hernia defect that was used for access ; the skin was then closed with 3 0 vicryl and  4 O monocryl and a sterile dressing was applied.  Instrument, sponge, and needle counts were correct at closure and at the conclusion of the case.   Findings: Cholelithiasis  Norma IOC  Estimated Blood Loss: less than 50 mL         Drains: none          Total IV Fluids: 800 mL         Specimens: Gallbladder           Complications: None; patient tolerated the procedure well.         Disposition: PACU - hemodynamically stable.         Condition: stable

## 2016-01-23 NOTE — Anesthesia Postprocedure Evaluation (Signed)
Anesthesia Post Note  Patient: Kathleen Benton  Procedure(s) Performed: Procedure(s) (LRB): LAPAROSCOPIC CHOLECYSTECTOMY WITH INTRAOPERATIVE CHOLANGIOGRAM (N/A)  Patient location during evaluation: PACU Anesthesia Type: General Level of consciousness: awake and alert Pain management: pain level controlled Vital Signs Assessment: post-procedure vital signs reviewed and stable Respiratory status: spontaneous breathing, nonlabored ventilation, respiratory function stable and patient connected to nasal cannula oxygen Cardiovascular status: blood pressure returned to baseline and stable Postop Assessment: no signs of nausea or vomiting Anesthetic complications: no       Last Vitals:  Vitals:   01/23/16 1146 01/23/16 1218  BP: 133/77 131/90  Pulse: (!) 58 62  Resp: 17   Temp:      Last Pain:  Vitals:   01/23/16 1218  TempSrc:   PainSc: Asleep                 Cecile HearingStephen Edward Turk

## 2016-01-23 NOTE — Discharge Instructions (Signed)
[facility] ______[facility] °LAPAROSCOPIC SURGERY: POST OP INSTRUCTIONS °Always review your discharge instruction sheet given to you by the facility where your surgery was performed. °IF YOU HAVE DISABILITY OR FAMILY LEAVE FORMS, YOU MUST BRING THEM TO THE OFFICE FOR PROCESSING.   °DO NOT GIVE THEM TO YOUR DOCTOR. ° °1. A prescription for pain medication may be given to you upon discharge.  Take your pain medication as prescribed, if needed.  If narcotic pain medicine is not needed, then you may take acetaminophen (Tylenol) or ibuprofen (Advil) as needed. °2. Take your usually prescribed medications unless otherwise directed. °3. If you need a refill on your pain medication, please contact your pharmacy.  They will contact our office to request authorization. Prescriptions will not be filled after 5pm or on week-ends. °4. You should follow a light diet the first few days after arrival home, such as soup and crackers, etc.  Be sure to include lots of fluids daily. °5. Most patients will experience some swelling and bruising in the area of the incisions.  Ice packs will help.  Swelling and bruising can take several days to resolve.  °6. It is common to experience some constipation if taking pain medication after surgery.  Increasing fluid intake and taking a stool softener (such as Colace) will usually help or prevent this problem from occurring.  A mild laxative (Milk of Magnesia or Miralax) should be taken according to package instructions if there are no bowel movements after 48 hours. °7. Unless discharge instructions indicate otherwise, you may remove your bandages 24-48 hours after surgery, and you may shower at that time.  You may have steri-strips (small skin tapes) in place directly over the incision.  These strips should be left on the skin for 7-10 days.  If your surgeon used skin glue on the incision, you may shower in 24 hours.  The glue will flake off over the next 2-3 weeks.  Any sutures or  staples will be removed at the office during your follow-up visit. °8. ACTIVITIES:  You may resume regular (light) daily activities beginning the next day--such as daily self-care, walking, climbing stairs--gradually increasing activities as tolerated.  You may have sexual intercourse when it is comfortable.  Refrain from any heavy lifting or straining until approved by your doctor. °a. You may drive when you are no longer taking prescription pain medication, you can comfortably wear a seatbelt, and you can safely maneuver your car and apply brakes. °b. RETURN TO WORK:  __________________________________________________________ °9. You should see your doctor in the office for a follow-up appointment approximately 2-3 weeks after your surgery.  Make sure that you call for this appointment within a day or two after you arrive home to insure a convenient appointment time. °10. OTHER INSTRUCTIONS: __________________________________________________________________________________________________________________________ __________________________________________________________________________________________________________________________ °WHEN TO CALL YOUR DOCTOR: °1. Fever over 101.0 °2. Inability to urinate °3. Continued bleeding from incision. °4. Increased pain, redness, or drainage from the incision. °5. Increasing abdominal pain ° °The clinic staff is available to answer your questions during regular business hours.  Please don’t hesitate to call and ask to speak to one of the nurses for clinical concerns.  If you have a medical emergency, go to the nearest emergency room or call 911.  A surgeon from [facility] is always on call at the hospital. °[address], [address], [place], [place]  [address] ? [address] [address], [place], [place]   [address] °[phone] ? [phone] ? FAX [phone] °Web site:   [email or web address] °

## 2016-01-24 ENCOUNTER — Encounter (HOSPITAL_COMMUNITY): Payer: Self-pay | Admitting: Surgery

## 2017-06-19 IMAGING — DX DG CHEST 2V
2 series · 2 of 2 positions shown · non-contrast
Comparison: None.

CLINICAL DATA: 55-year-old female with chest pain.

EXAM:
CHEST  2 VIEW

[chest pa]
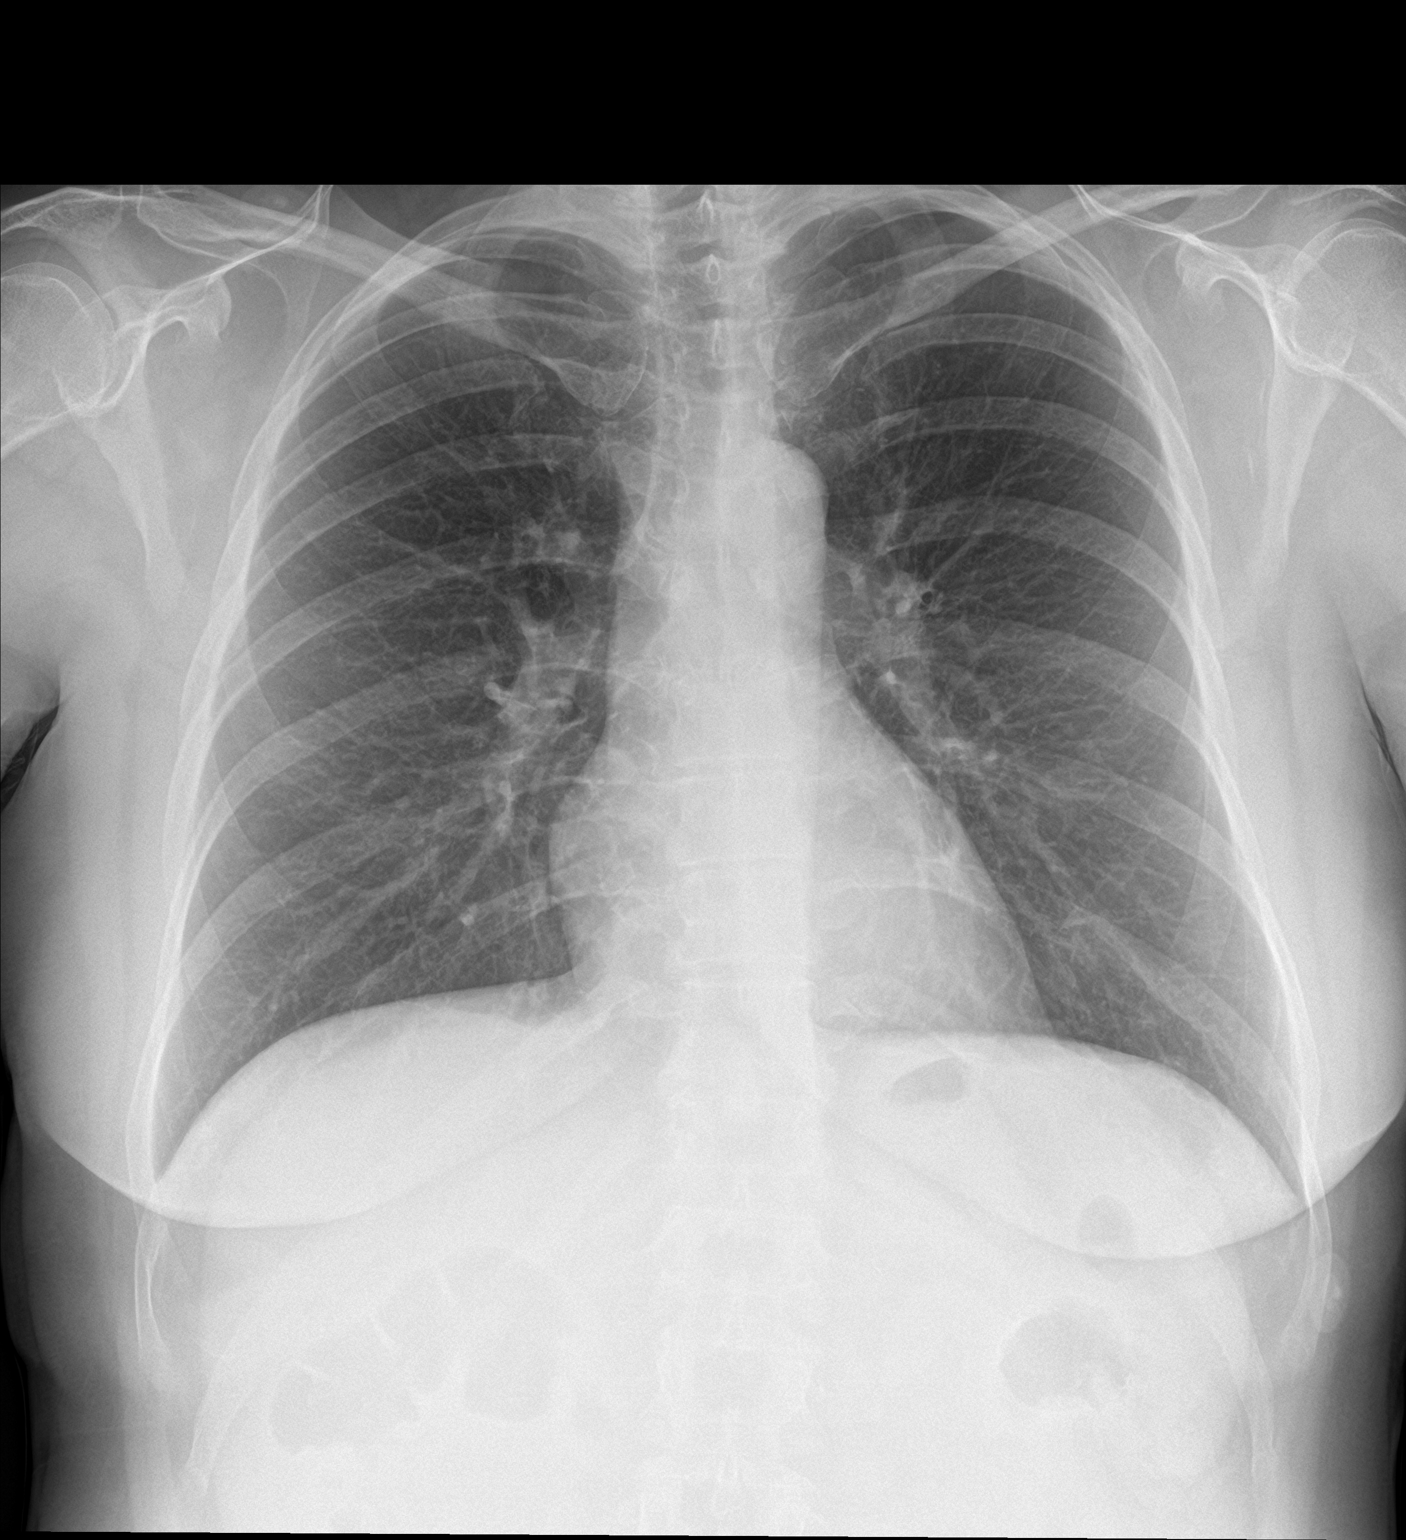

[chest lat]
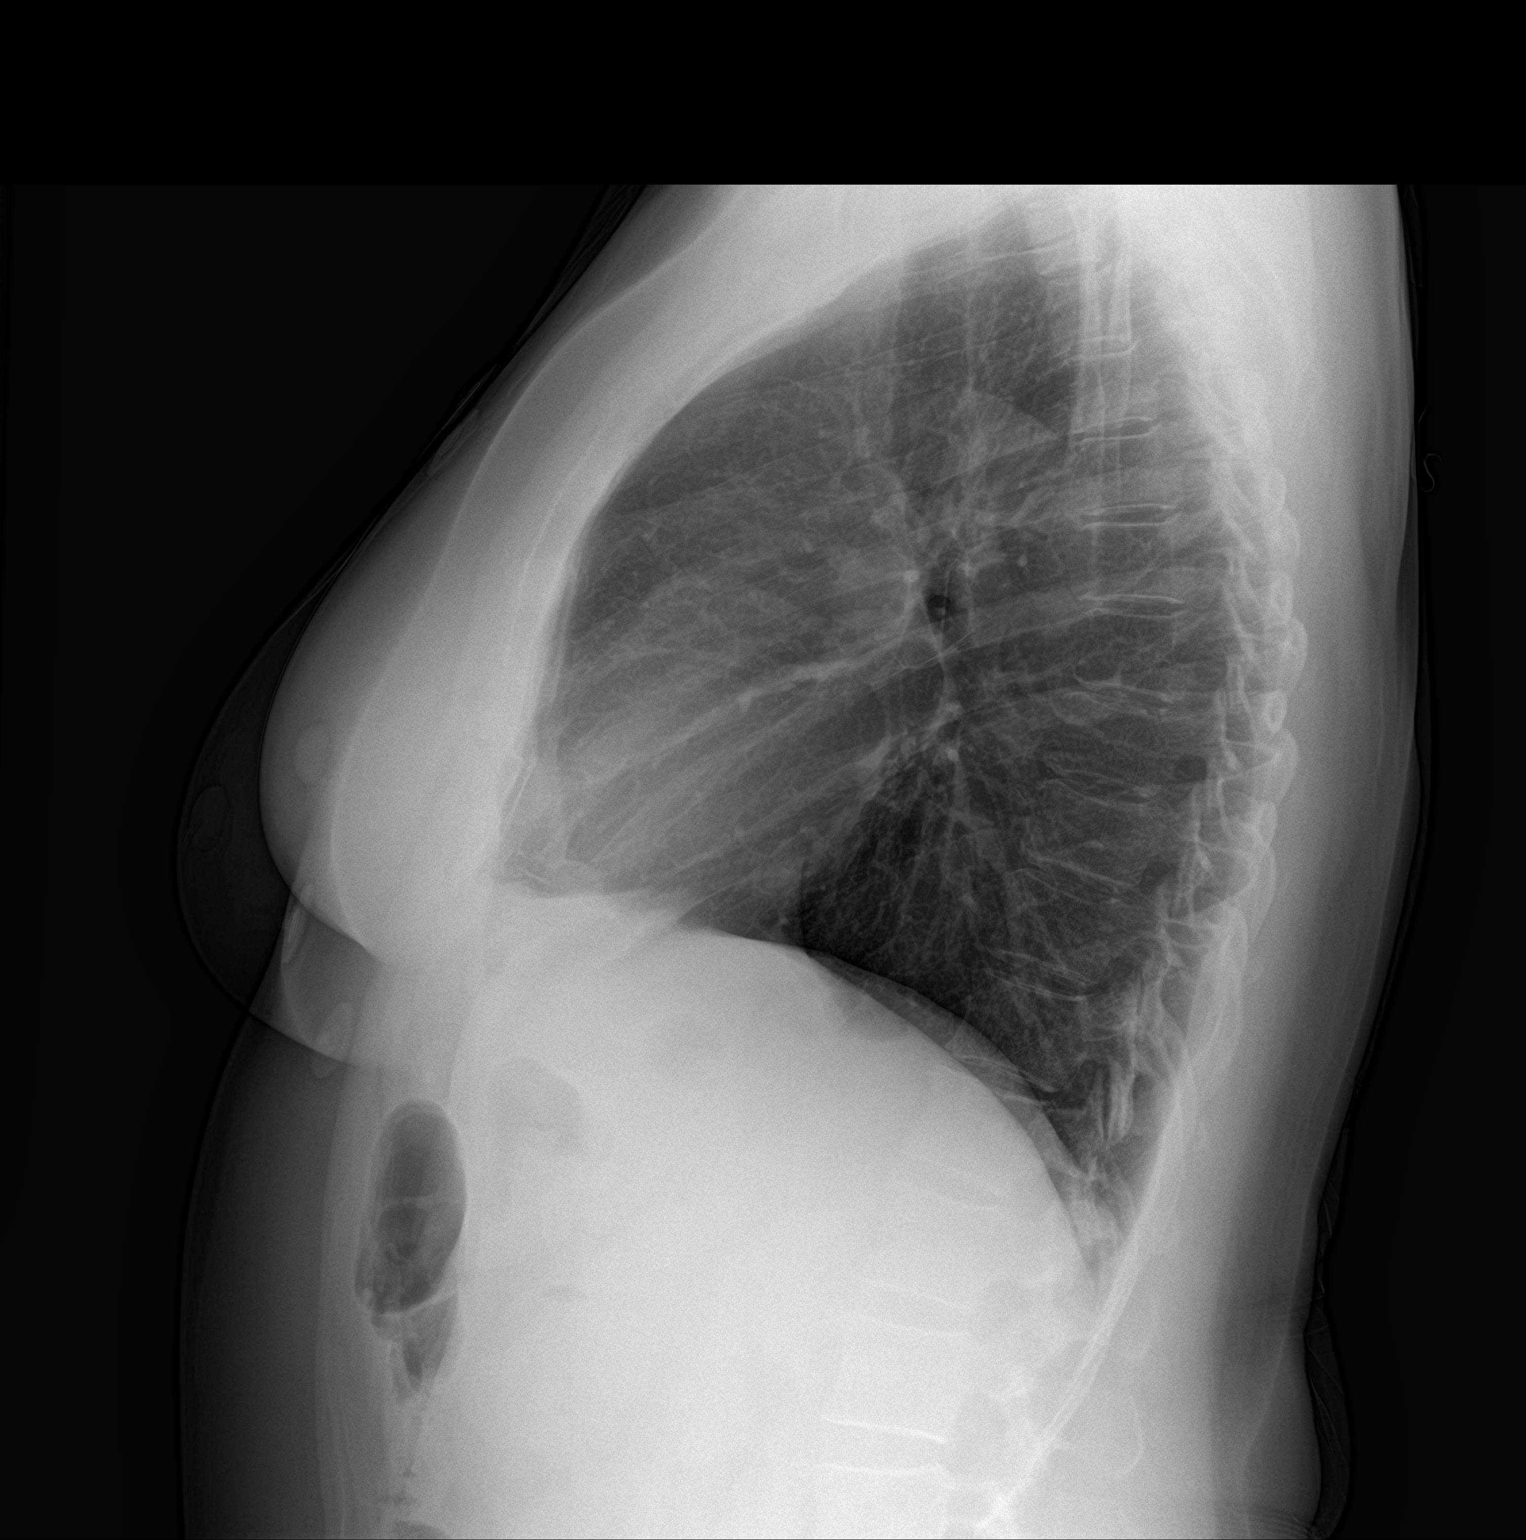

[2 of 2 positions shown; findings below may reference images not displayed]

FINDINGS: The cardiomediastinal silhouette is unremarkable.

There is no evidence of focal airspace disease, pulmonary edema,
suspicious pulmonary nodule/mass, pleural effusion, or pneumothorax.
No acute bony abnormalities are identified.
IMPRESSION: No active cardiopulmonary disease.

## 2017-08-05 IMAGING — US US ABDOMEN LIMITED
1 series · 14 of 25 positions shown · non-contrast
Comparison: None.

CLINICAL DATA: Epigastric pain.

EXAM:
US ABDOMEN LIMITED - RIGHT UPPER QUADRANT

[Series 1: us abdomen limited · 0.31mm/px · 14 of 56 slices shown]
[im 1/56]
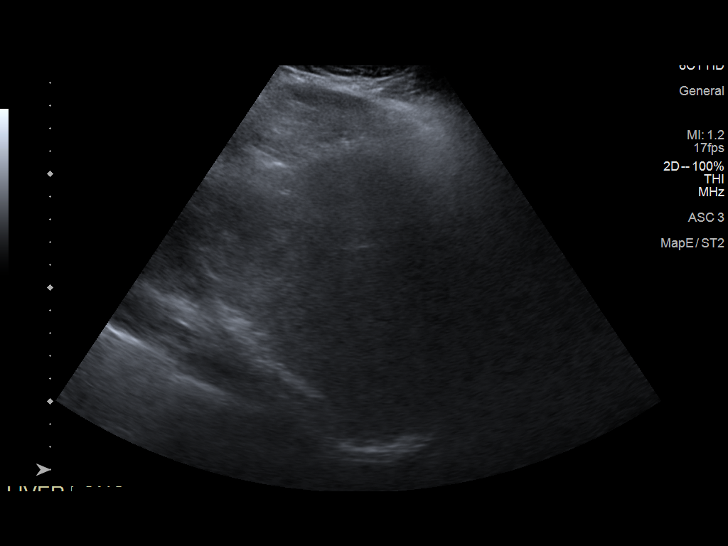
[im 5/56]
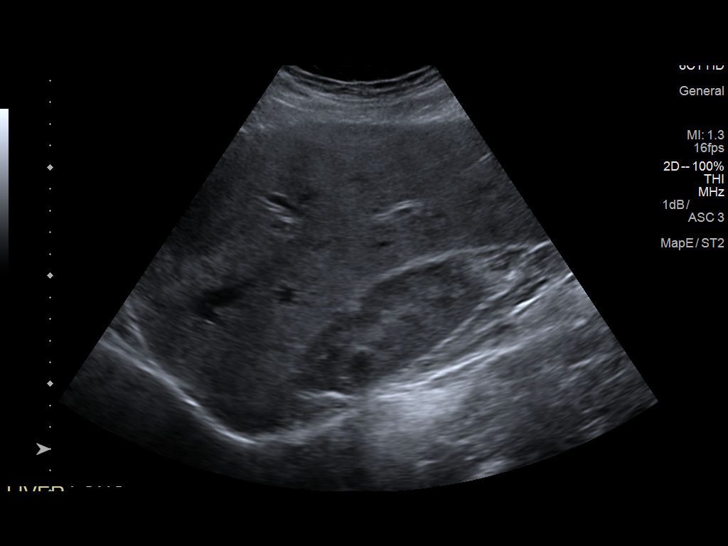
[im 10/56]
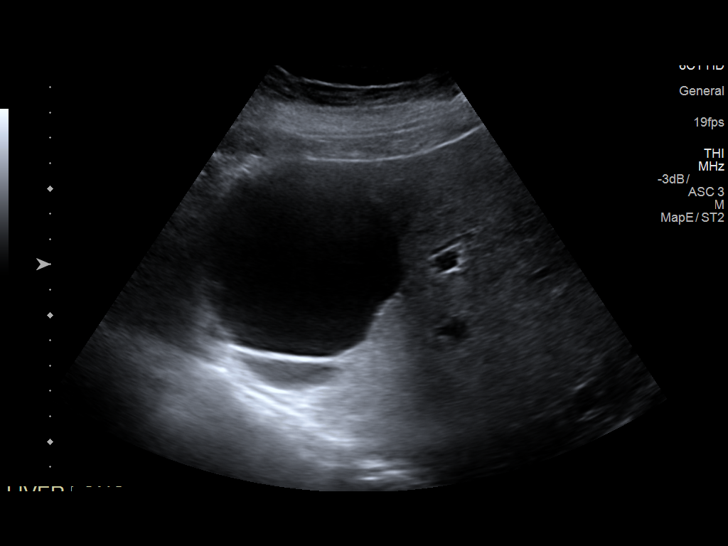
[im 14/56]
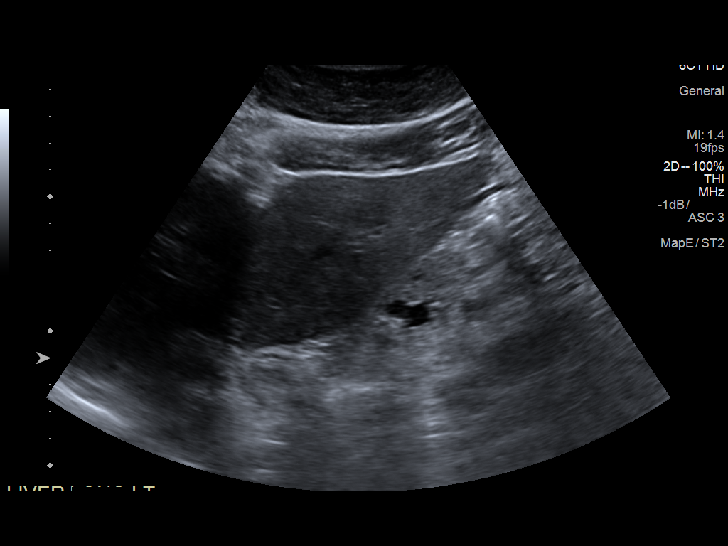
[im 19/56]
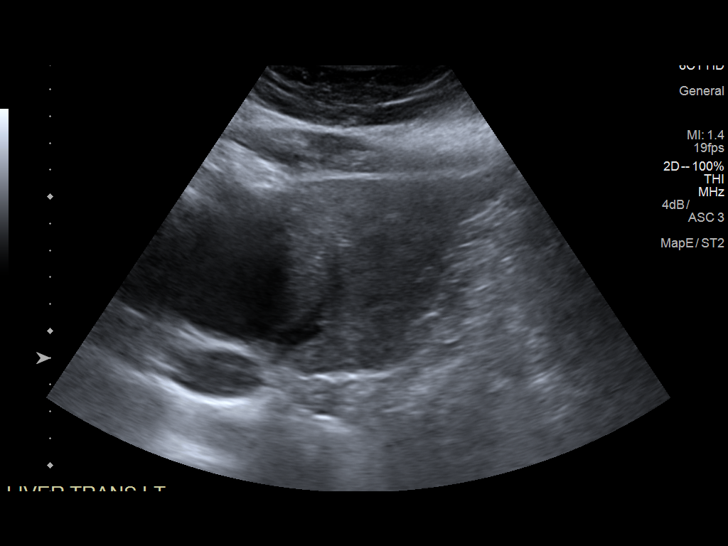
[im 21/56]
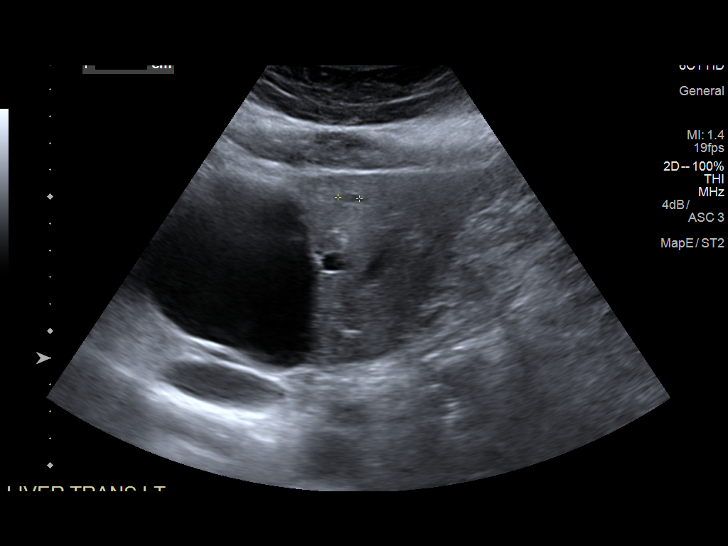
[im 26/56]
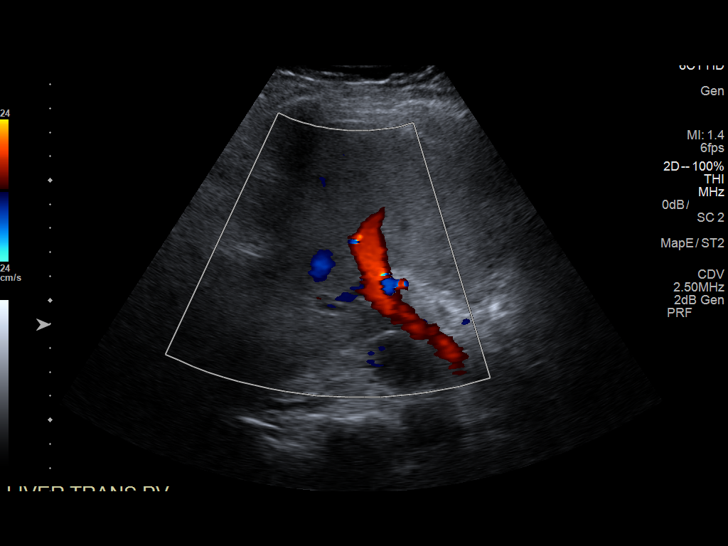
[im 30/56]
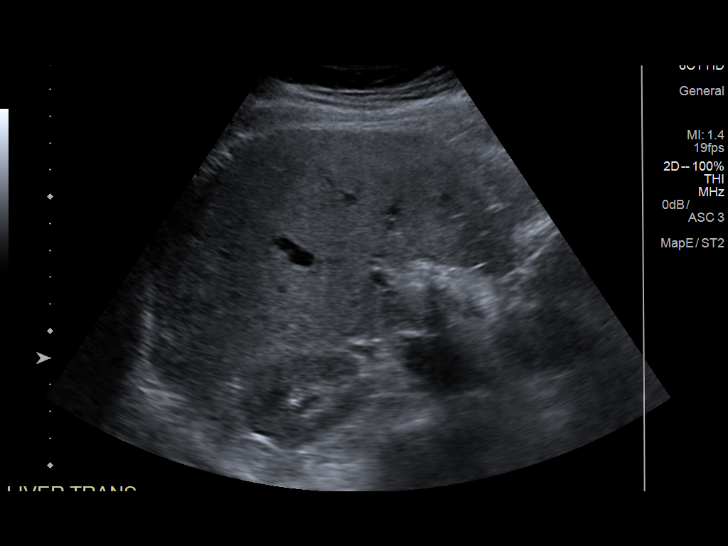
[im 35/56]
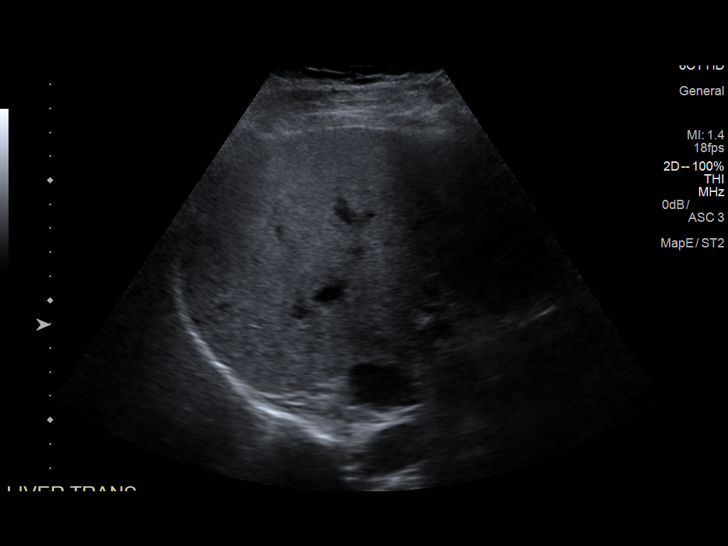
[im 37/56]
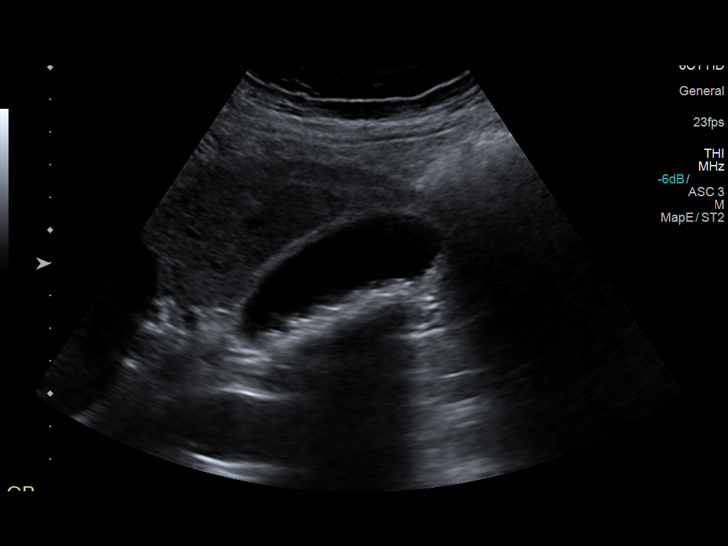
[im 42/56]
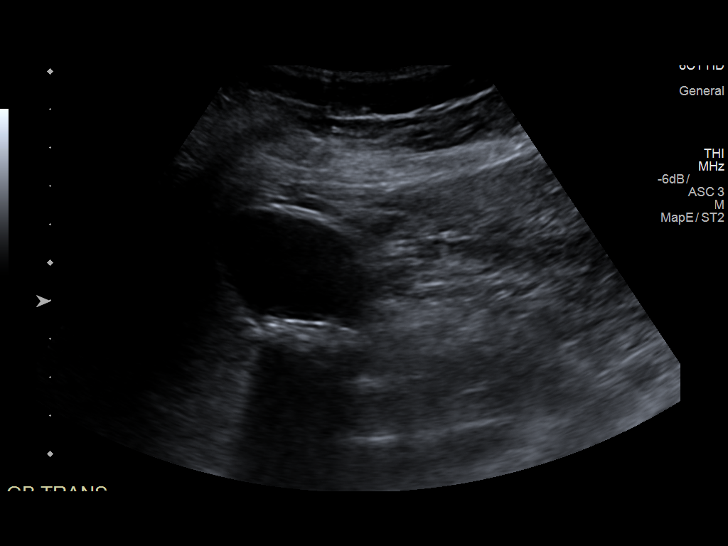
[im 46/56]
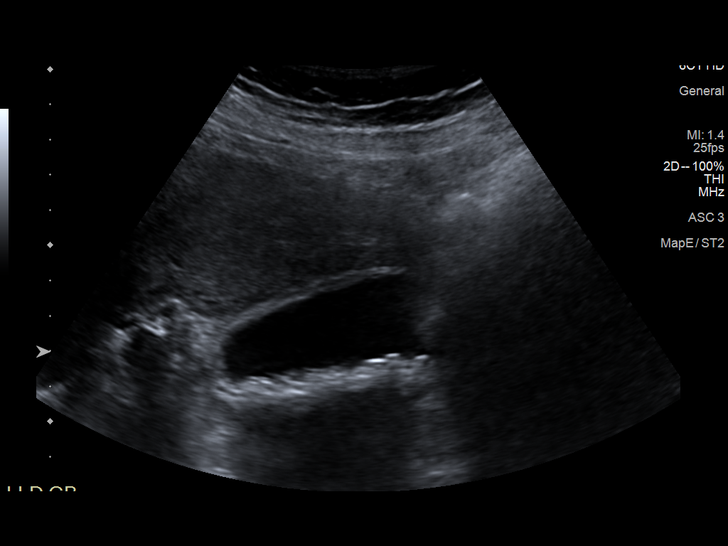
[im 51/56]
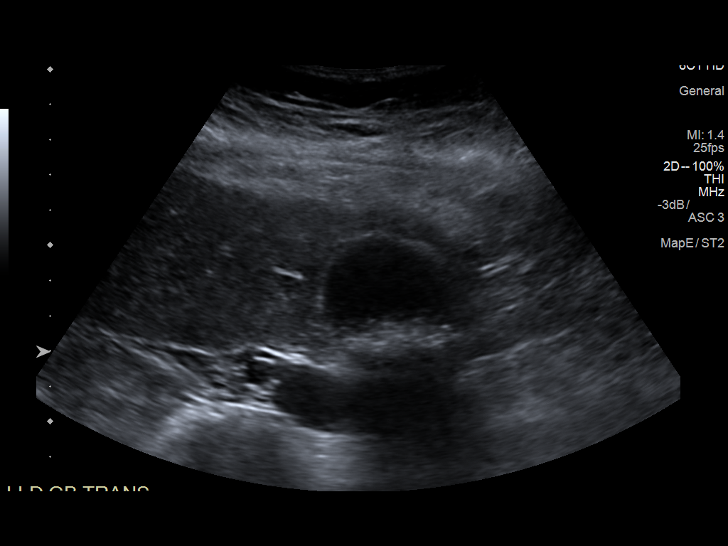
[im 56/56]
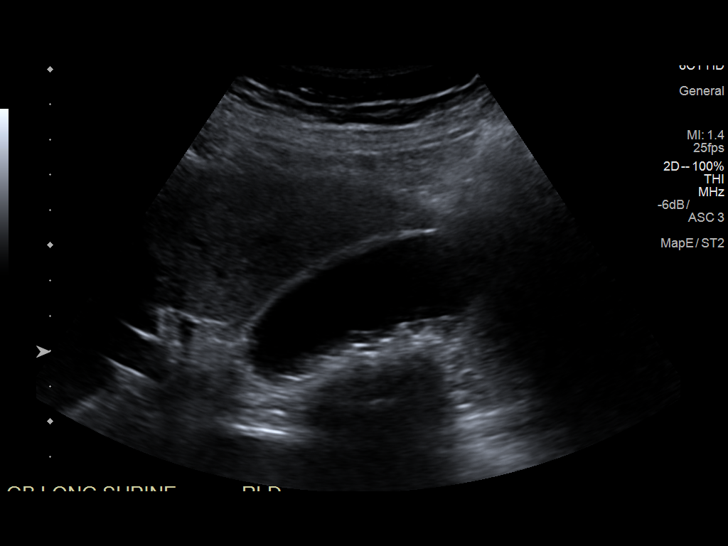

[14 of 25 positions shown; findings below may reference images not displayed]

FINDINGS: Gallbladder:

Layering stones and sludge are seen in the gallbladder. The
gallbladder wall is thickened measuring 4.1 mm and there appears to
be fluid within the wall. No Murphy's sign reported. No
pericholecystic fluid.

Common bile duct:

Diameter: 6.2 mm

Liver:

Multiple cysts in the liver.  The largest measures nearly 9 cm.
IMPRESSION: 1. Cholelithiasis and sludge in the gallbladder with wall
thickening. There also appears to be fluid within the wall of
gallbladder but no Murphy's sign or perinephric stranding is
identified. The common bile duct is also near the upper limits of
normal. Acute cholecystitis cannot be excluded based on this study
alone. However, given the lack of a Murphy's sign or pericholecystic
fluid, a HIDA scan should be considered to evaluate cystic duct
patency.
